# Patient Record
Sex: Male | Born: 1955 | Race: White | Hispanic: No | Marital: Married | Smoking: Current every day smoker
Health system: Southern US, Community
[De-identification: ages and names within clinical notes are randomized; demographics above are authoritative.]

## PROBLEM LIST (undated history)

## (undated) DIAGNOSIS — D61818 Other pancytopenia: Secondary | ICD-10-CM

## (undated) DIAGNOSIS — C61 Malignant neoplasm of prostate: Secondary | ICD-10-CM

## (undated) HISTORY — DX: Malignant neoplasm of prostate: C61

## (undated) HISTORY — DX: Other pancytopenia: D61.818

---

## 2006-05-23 ENCOUNTER — Encounter: Admission: RE | Admit: 2006-05-23 | Discharge: 2006-05-23 | Payer: Self-pay | Admitting: Internal Medicine

## 2006-06-06 ENCOUNTER — Other Ambulatory Visit: Admission: RE | Admit: 2006-06-06 | Discharge: 2006-06-06 | Payer: Self-pay | Admitting: Interventional Radiology

## 2006-06-06 ENCOUNTER — Encounter: Admission: RE | Admit: 2006-06-06 | Discharge: 2006-06-06 | Payer: Self-pay | Admitting: Internal Medicine

## 2006-06-06 ENCOUNTER — Encounter (INDEPENDENT_AMBULATORY_CARE_PROVIDER_SITE_OTHER): Payer: Self-pay | Admitting: Specialist

## 2008-08-27 ENCOUNTER — Encounter: Admission: RE | Admit: 2008-08-27 | Discharge: 2008-08-27 | Payer: Self-pay | Admitting: Occupational Medicine

## 2009-04-02 ENCOUNTER — Encounter: Admission: RE | Admit: 2009-04-02 | Discharge: 2009-04-02 | Payer: Self-pay | Admitting: Family Medicine

## 2010-12-08 IMAGING — US US SOFT TISSUE HEAD/NECK
1 series · 14 of 25 positions shown · non-contrast
Comparison: Ultrasound of the thyroid of 05/23/2006

CLINICAL DATA: Right thyroid nodule noted on physical exam

THYROID ULTRASOUND
TECHNIQUE: Ultrasound examination of the thyroid gland and
adjacent soft tissues was performed.

[Series 1: us soft tissue head/neck · 0.06mm/px · 14 of 25 slices shown]
[im 1/25]
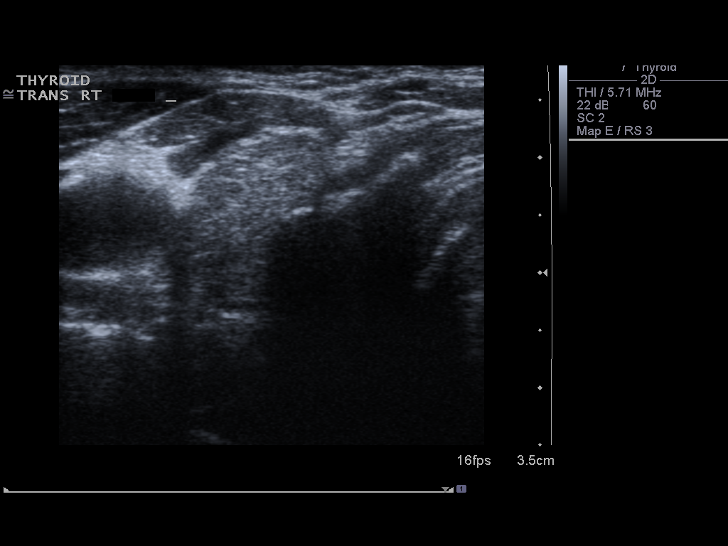
[im 3/25]
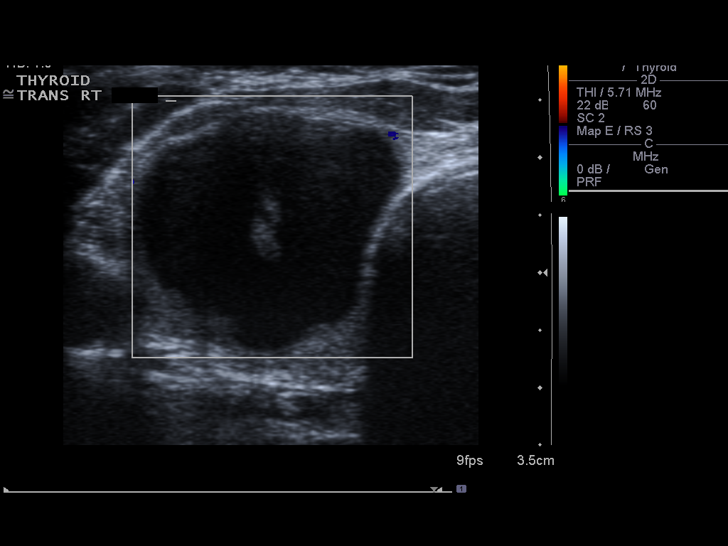
[im 5/25]
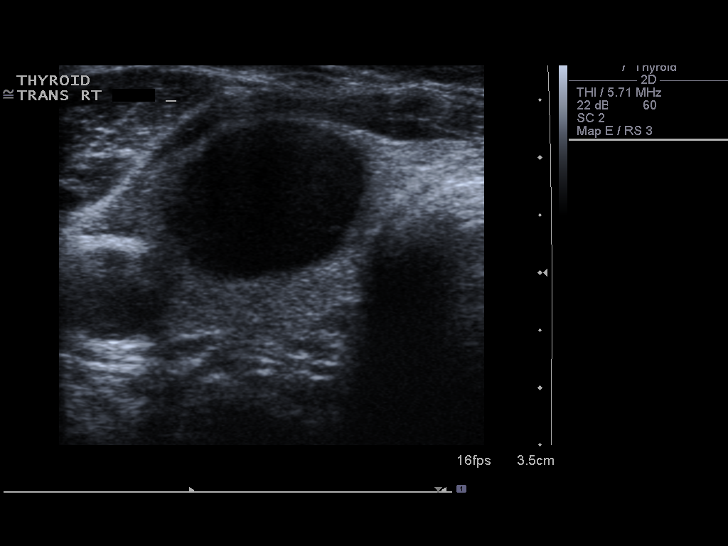
[im 7/25]
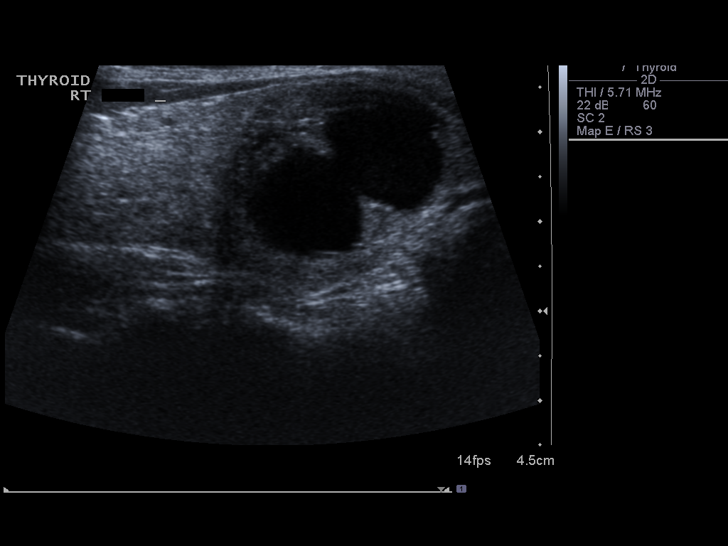
[im 9/25]
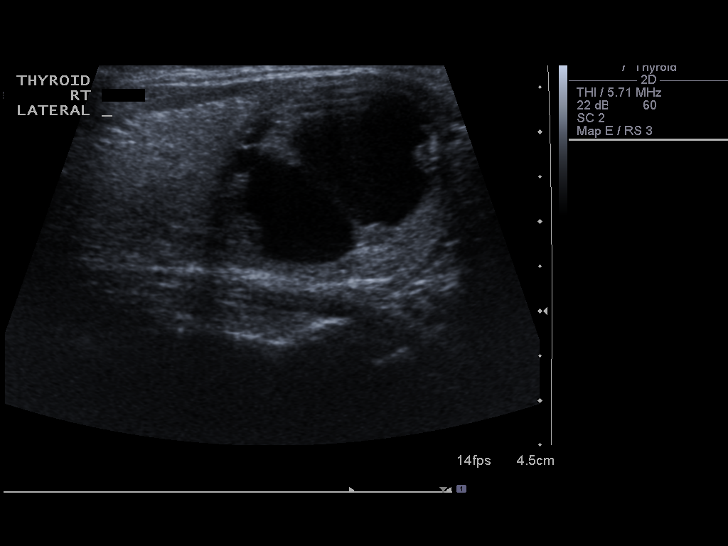
[im 10/25]
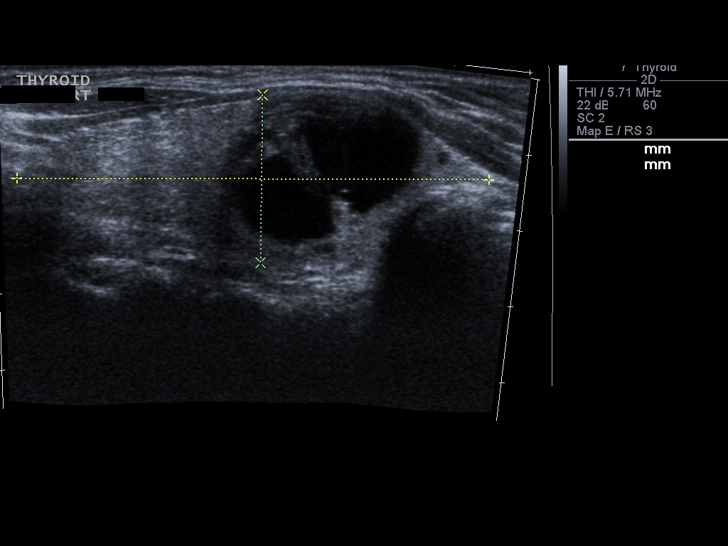
[im 12/25]
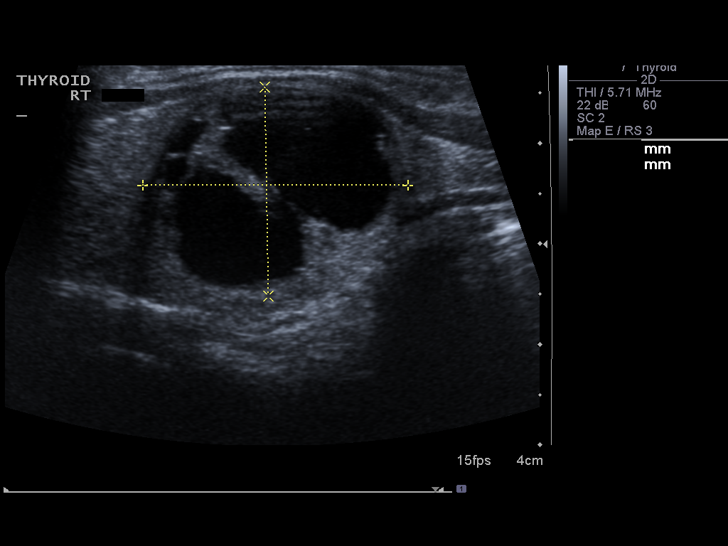
[im 14/25]
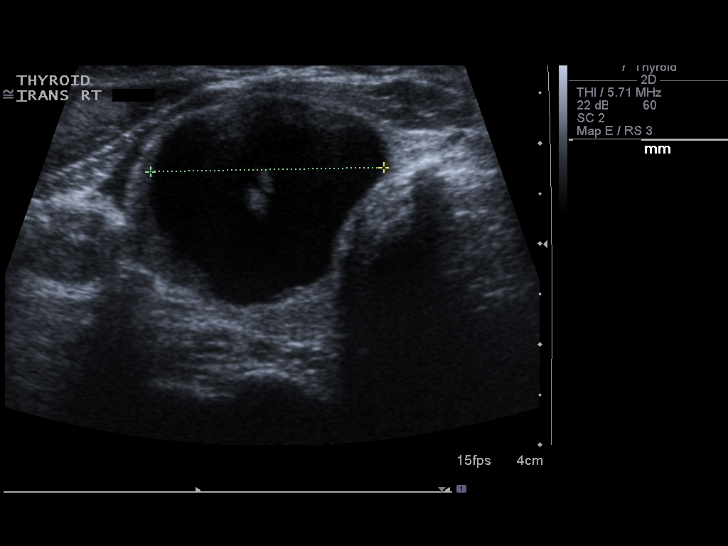
[im 16/25]
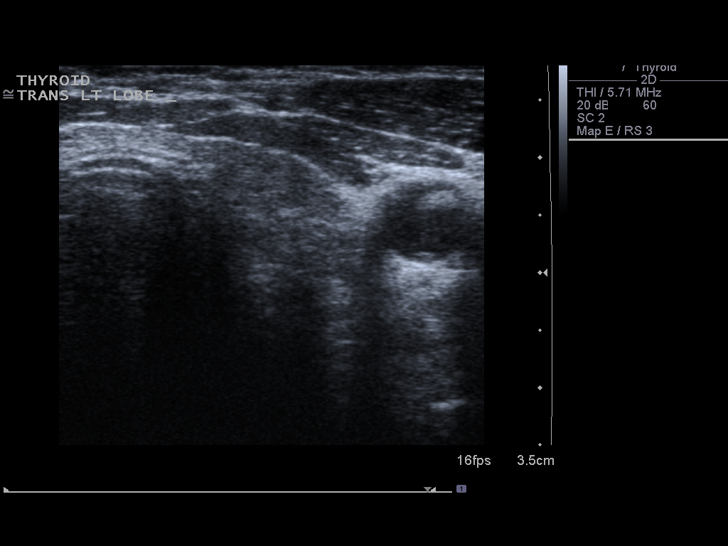
[im 17/25]
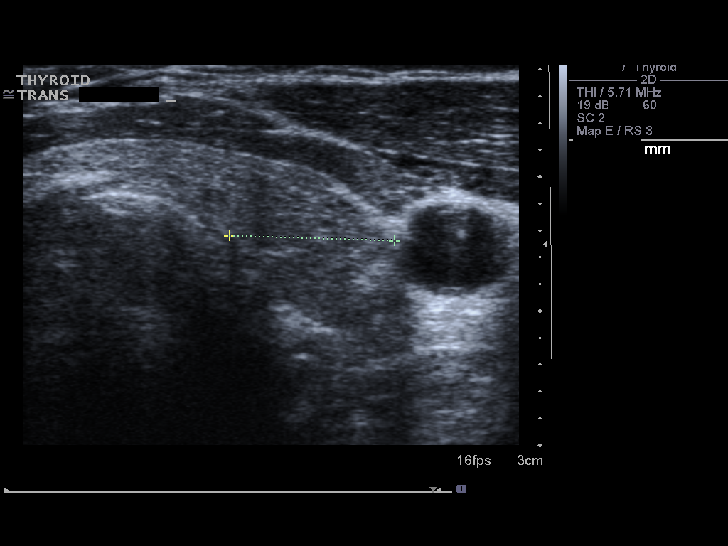
[im 19/25]
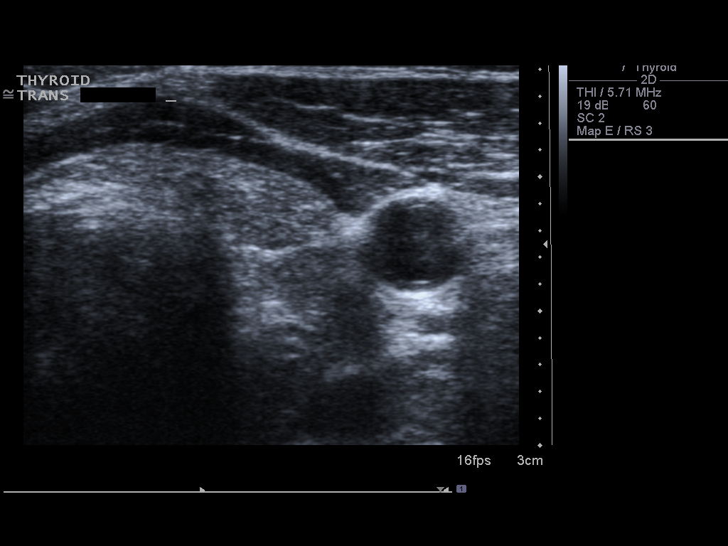
[im 21/25]
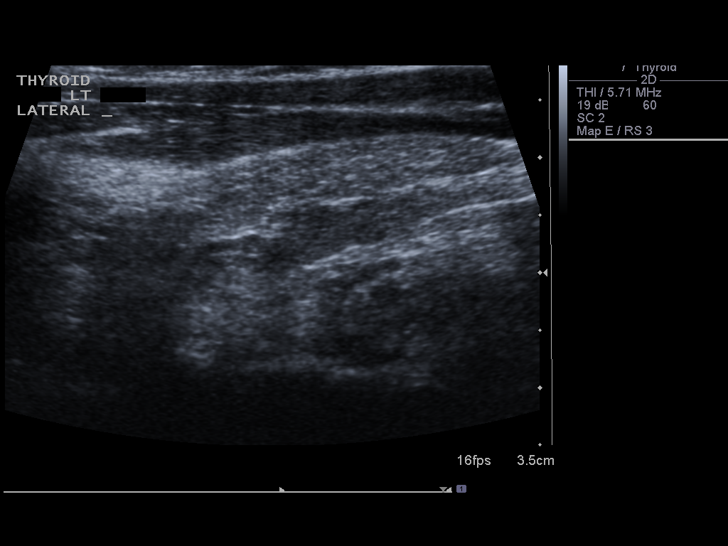
[im 23/25]
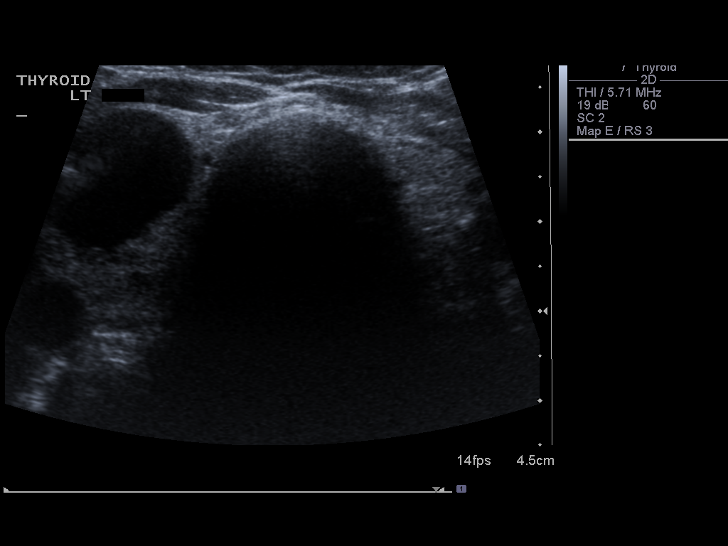
[im 25/25]
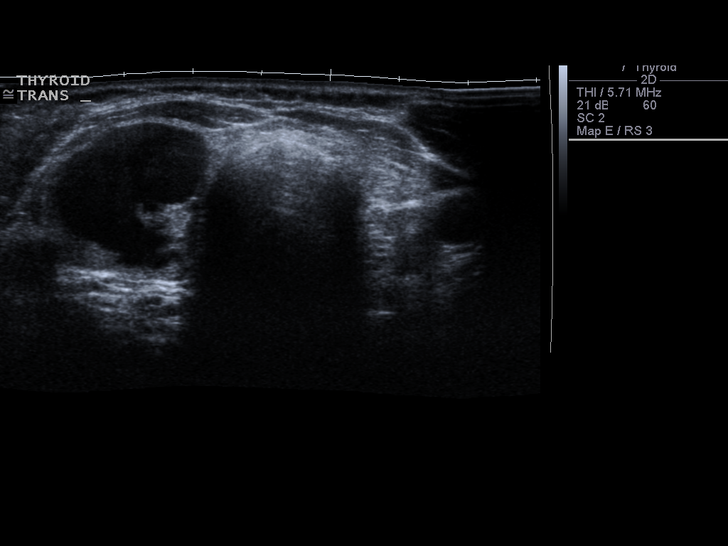

[14 of 25 positions shown; findings below may reference images not displayed]

FINDINGS: The thyroid gland is stable in size compared to the prior
study.  The right lobe measures 6.2 cm sagittally with a depth of
2.2 cm and width of 2.9 cm.  (Prior measurements were 5.5 x 2.0 x
1.9 cm).  The left lobe currently measures 4.5 x 1.1 x 1.2 cm with
the isthmus measuring 2 mm in thickness.  (Prior measurements were
4.6 x 1.1 x 1.2 cm with the isthmus measuring 3 mm).

The gland remains homogeneous.  Within the right lower lobe there
is a predominately cystic nodule present with some solid components
and septations measuring 2.6 x 2.1 x 2.3 cm.  This complex lesion
has not changed significantly in the interval where it previously
measured 1.9 x 1.2 x 1.8 cm. Also, this nodule was biopsied into
2660.  No additional thyroid nodule is seen.
IMPRESSION: No significant change in primarily cystic right lobe of thyroid
nodule with some internal septation when compared to the prior
study of 0556.  No new abnormality.

## 2013-10-01 ENCOUNTER — Ambulatory Visit (HOSPITAL_COMMUNITY)
Admission: RE | Admit: 2013-10-01 | Discharge: 2013-10-01 | Disposition: A | Discharge status: Home / Self Care | Source: Ambulatory Visit | Attending: Hematology & Oncology | Admitting: Hematology & Oncology

## 2013-10-01 DIAGNOSIS — C800 Disseminated malignant neoplasm, unspecified: Secondary | ICD-10-CM | POA: Insufficient documentation

## 2013-10-01 DIAGNOSIS — C61 Malignant neoplasm of prostate: Secondary | ICD-10-CM | POA: Insufficient documentation

## 2013-10-15 ENCOUNTER — Encounter: Payer: Self-pay | Admitting: *Deleted

## 2013-10-18 ENCOUNTER — Telehealth: Payer: Self-pay | Admitting: Hematology & Oncology

## 2013-10-18 ENCOUNTER — Other Ambulatory Visit: Payer: Self-pay | Admitting: *Deleted

## 2013-10-18 DIAGNOSIS — C61 Malignant neoplasm of prostate: Secondary | ICD-10-CM

## 2013-10-18 NOTE — Telephone Encounter (Signed)
Left vm w NEW PATIENT today to remind them of their appointment with Dr. Ennever. Also, advised them to bring all medication bottles and insurance card information. ° °

## 2013-10-21 ENCOUNTER — Other Ambulatory Visit (HOSPITAL_BASED_OUTPATIENT_CLINIC_OR_DEPARTMENT_OTHER): Payer: Medicare Other | Admitting: Lab

## 2013-10-21 ENCOUNTER — Telehealth: Payer: Self-pay | Admitting: *Deleted

## 2013-10-21 ENCOUNTER — Encounter: Payer: Self-pay | Admitting: Hematology & Oncology

## 2013-10-21 ENCOUNTER — Other Ambulatory Visit: Payer: Self-pay | Admitting: Lab

## 2013-10-21 ENCOUNTER — Ambulatory Visit (HOSPITAL_BASED_OUTPATIENT_CLINIC_OR_DEPARTMENT_OTHER): Payer: Medicare Other | Admitting: Hematology & Oncology

## 2013-10-21 ENCOUNTER — Ambulatory Visit: Payer: Self-pay | Admitting: Hematology & Oncology

## 2013-10-21 ENCOUNTER — Ambulatory Visit: Payer: Managed Care, Other (non HMO)

## 2013-10-21 ENCOUNTER — Ambulatory Visit: Payer: Self-pay

## 2013-10-21 VITALS — BP 88/52 | HR 70 | Temp 98.2°F | Resp 18 | Ht 69.0 in | Wt 146.0 lb

## 2013-10-21 DIAGNOSIS — C61 Malignant neoplasm of prostate: Secondary | ICD-10-CM

## 2013-10-21 DIAGNOSIS — C800 Disseminated malignant neoplasm, unspecified: Secondary | ICD-10-CM

## 2013-10-21 DIAGNOSIS — C801 Malignant (primary) neoplasm, unspecified: Secondary | ICD-10-CM

## 2013-10-21 DIAGNOSIS — D61818 Other pancytopenia: Secondary | ICD-10-CM

## 2013-10-21 HISTORY — DX: Other pancytopenia: D61.818

## 2013-10-21 HISTORY — DX: Malignant neoplasm of prostate: C61

## 2013-10-21 LAB — CBC WITH DIFFERENTIAL (CANCER CENTER ONLY)
BASO#: 0 10*3/uL (ref 0.0–0.2)
BASO%: 0.4 % (ref 0.0–2.0)
EOS ABS: 0 10*3/uL (ref 0.0–0.5)
EOS%: 1.4 % (ref 0.0–7.0)
HCT: 27.9 % — ABNORMAL LOW (ref 38.7–49.9)
HGB: 9.7 g/dL — ABNORMAL LOW (ref 13.0–17.1)
LYMPH#: 0.6 10*3/uL — AB (ref 0.9–3.3)
LYMPH%: 20.4 % (ref 14.0–48.0)
MCH: 30.8 pg (ref 28.0–33.4)
MCHC: 34.8 g/dL (ref 32.0–35.9)
MCV: 89 fL (ref 82–98)
MONO#: 0.3 10*3/uL (ref 0.1–0.9)
MONO%: 11.9 % (ref 0.0–13.0)
NEUT%: 65.9 % (ref 40.0–80.0)
NEUTROS ABS: 1.9 10*3/uL (ref 1.5–6.5)
PLATELETS: 17 10*3/uL — AB (ref 145–400)
RBC: 3.15 10*6/uL — AB (ref 4.20–5.70)
RDW: 13 % (ref 11.1–15.7)
WBC: 2.9 10*3/uL — AB (ref 4.0–10.0)

## 2013-10-21 LAB — TECHNOLOGIST REVIEW CHCC SATELLITE

## 2013-10-21 LAB — CMP (CANCER CENTER ONLY)
ALT: 9 U/L — AB (ref 10–47)
AST: 21 U/L (ref 11–38)
Albumin: 2.9 g/dL — ABNORMAL LOW (ref 3.3–5.5)
Alkaline Phosphatase: 587 U/L — ABNORMAL HIGH (ref 26–84)
BILIRUBIN TOTAL: 0.9 mg/dL (ref 0.20–1.60)
BUN, Bld: 12 mg/dL (ref 7–22)
CALCIUM: 9 mg/dL (ref 8.0–10.3)
CHLORIDE: 101 meq/L (ref 98–108)
CO2: 29 meq/L (ref 18–33)
Creat: 0.5 mg/dl — ABNORMAL LOW (ref 0.6–1.2)
Glucose, Bld: 106 mg/dL (ref 73–118)
POTASSIUM: 4.3 meq/L (ref 3.3–4.7)
SODIUM: 141 meq/L (ref 128–145)
TOTAL PROTEIN: 6.7 g/dL (ref 6.4–8.1)

## 2013-10-21 LAB — HOLD TUBE, BLOOD BANK - CHCC SATELLITE

## 2013-10-21 MED ORDER — FENTANYL 100 MCG/HR TD PT72: 100.0000 ug | MEDICATED_PATCH | TRANSDERMAL | Status: DC

## 2013-10-21 MED ORDER — FENTANYL 100 MCG/HR TD PT72: MEDICATED_PATCH | TRANSDERMAL | Status: DC

## 2013-10-21 MED ORDER — AMINOCAPROIC ACID 500 MG PO TABS: ORAL_TABLET | ORAL | Status: DC

## 2013-10-21 MED ORDER — FENTANYL CITRATE 100 MCG BU TABS: 100.0000 ug | ORAL_TABLET | BUCCAL | Status: DC | PRN

## 2013-10-21 MED ORDER — ALPRAZOLAM 1 MG PO TABS: 1.0000 mg | ORAL_TABLET | Freq: Every evening | ORAL | Status: DC | PRN

## 2013-10-21 MED ORDER — ALPRAZOLAM 1 MG PO TABS
1.0000 mg | ORAL_TABLET | Freq: Every evening | ORAL | Status: AC | PRN
Start: 2013-10-21 — End: ?

## 2013-10-21 NOTE — Telephone Encounter (Signed)
Made referral for [facility] per Dr Marin Olp order.

## 2013-10-21 NOTE — Progress Notes (Signed)
Referral MD  Reason for Referral:  Metastatic castrateresistant prostate cancer  Chief Complaint  Patient presents with  . NEW PATIENT  : I just moved up from [place] to be care for him I family  HPI: Mr. Pullman is a very nice 58 year old gentleman.He just got appear from soft to loud. He been down in [place]. He been getting care for metastatic prostate cancer. He's been through all lines of therapy.  He presented with localized prostate cancer about 4 years ago. He was treated at [facility]. He underwent a laparoscopic prostatectomy. At the time of his diagnosis, his PSA was about 9.  His PSA went down to less than 0.01. Unfortunately, he then began to have an increase in the PSA. The studies did not show any obvious metastatic disease. He underwent salvage radiation therapy at [facility].  PSA continued to rise. He was then enrolled in a clinical trial of. He was randomized to androgen depravation therapy. He went off study in June of 2010. He was placed on Lupron and Casodex.  His PSA improved a little bit.  He then went on toshow progression. He was then treated with Provenge He received this from February to 05/05/10. His PSA continued to rise. He was then placed onto [facility]. He there was given Zytiga.  You follow her onto chemotherapy with Taxotere and carboplatin. He had 2 cycles. He did have a reaction to the chemotherapy.  He then was placed on cabazitaxel. He had 11 cycles. His PSA seemed to improve.he had more hip pain. He did get radiation therapy. He was then started on some radium-223. This was in December of 2013. He then had carboplatinum given with cazaitaxel.. There was no benefit.  He had more radiation therapy at [facility] for an epidural mass.  He was started on XTANDI in June 2015.  He last got Lupron in I think in July. He also got Xgeva in July.  He did have genetic studies done. He had a battery of genetic tests done. This was in August of 2015. He was thought to  have 5 different genetic alterations.  He fine movement of to the [place] area. He is doing appear. Unfortunately, his wife passed away back in late May 05, 2022 of metastatic breast cancer. She did not have for that long.  He had onset of pain got appear. This I brought him. He is on a Duragesic patch. He does have that increased to 200 mcg a day. He is on Dilaudid for breakthrough pain.  He is eating okay. He had lost quite awake. He's had no bleeding.  We are asked to see him so we can establish care with him and allow him to have a good quality of life.  He was getting blood work done weekly. He is getting transfused almost weekly. Sometimes to get to platelet is use week.  He's not on Amicarr. This is quite shocking to me he says his platelet count is still low. II think thatthe Amicar can help.  I would only transfuse platelets if he is bleeding or if his blood count less than 5000. His resolution transfused 4 a hemoglobin less than 8.  His genetic alterations, he has the following:  TP53, CTNNB1, NF1, MYC, AR.  Currently, his performance status is ECOG 3.  He clearly needs hospice. I will see about getting this set up for him.  We will go ahead and get his prescriptions refilled.         Past Medical  History  Diagnosis Date  . Prostate cancer metastatic to multiple sites 10/21/2013  . Acquired pancytopenia 10/21/2013  :  No past surgical history on file.:  Current outpatient prescriptions:buPROPion (WELLBUTRIN XL) 300 MG 24 hr tablet, Take 300 mg by mouth daily., Disp: , Rfl: ;  Celecoxib (CELEBREX PO), Take 200 mg by mouth 2 (two) times daily., Disp: , Rfl: ;  Denosumab (XGEVA [place]), Inject into the skin every 3 (three) months. LAST DOSE  08-05-13, Disp: , Rfl: ;  fentaNYL (DURAGESIC - DOSED MCG/HR) 100 MCG/HR, Apply 2 patches to skin every 2 days, Disp: 30 patch, Rfl: 0 fentaNYL (FENTORA) 100 MCG buccal tablet, Place 1 tablet (100 mcg total) inside cheek every 4 (four) hours as  needed for breakthrough pain or severe pain (takes 200 mg patches)., Disp: 60 tablet, Rfl: 0;  HYDROmorphone (DILAUDID) 4 MG tablet, Take by mouth every 4 (four) hours as needed for severe pain., Disp: , Rfl: ;  leuprolide (LUPRON) 22.5 MG injection, Inject 22.5 mg into the muscle every 3 (three) months. LAST DOSE 04-22-13, Disp: , Rfl:  lidocaine (LIDODERM) 5 %, Place 1 patch onto the skin daily. Remove & Discard patch within 12 hours or as directed by MD, Disp: , Rfl: ;  ondansetron (ZOFRAN) 8 MG tablet, Take by mouth every 8 (eight) hours as needed for nausea or vomiting., Disp: , Rfl: ;  predniSONE (DELTASONE) 10 MG tablet, Take 10 mg by mouth daily with breakfast., Disp: , Rfl: ;  Pregabalin (LYRICA PO), Take by mouth., Disp: , Rfl:  ALPRAZolam (XANAX) 1 MG tablet, Take 1 tablet (1 mg total) by mouth at bedtime as needed for anxiety., Disp: 90 tablet, Rfl: 0;  aminocaproic acid (AMICAR) 500 MG tablet, Take 2 tablets 3 times a day, Disp: 180 tablet, Rfl: 2:  :  No Known Allergies:  No family history on file.:  History   Social History  . Marital Status: Married    Spouse Name: N/A    Number of Children: N/A  . Years of Education: N/A   Occupational History  . Not on file.   Social History Main Topics  . Smoking status: Current Every Day Smoker -- 1.50 packs/day for 27 years    Types: Cigarettes    Start date: 03/23/1985  . Smokeless tobacco: Never Used     Comment: still smoking   10-2013  . Alcohol Use: Not on file  . Drug Use: Not on file  . Sexual Activity: Not on file   Other Topics Concern  . Not on file   Social History Narrative  . No narrative on file  :  Pertinent items are noted in HPI.  Exam: $Remo'@[email or web address]'[id number]$ @ Thin white gentleman in no obvious distress. Vital signs show a temperature of 98.2. Pulse 70. Blood pressure 88/52. Weight is 146 pounds. Head and exam shows no ocular or oral lesions. He has no palpable cervical or supraclavicular lymph nodes. Lungs are clear.  Cardiac exam regular in rhythm with a normal S1 and S2. There are no murmurs rubs or bruits. Abdomen is soft. He has good bowel sounds. There is a palpable abdominal mass. He has no fluid wave. There is no guarding or rebound tenderness. He has no palpable liver or spleen tip. Back exam shows no tenderness over the spine, ribs or hips. Extremities shows some atrophy in upper and lower extremities.He has decent strength in his legs. Neurological exam shows no focal neurological deficits.   Recent Labs  10/21/13 0844  WBC  2.9*  HGB 9.7*  HCT 27.9*  PLT 17*    Recent Labs  10/21/13 0844  NA 141  K 4.3  CL 101  CO2 29  GLUCOSE 106  BUN 12  CREATININE 0.5*  CALCIUM 9.0    Blood smear review: no data  Pathology:no data   Assessment and Plan: Mr. Rogerson is a very nice 58 year old gentleman. He has castrate resistant prostate cancer. He's been through all therapy.the goal is to get him as good of a quality of life as possible.  Again, we will get hospice involved. I think this will be very important.  Am surprised that he did does not have a central line. I think a PICC line will be incredibly important. I will see if radiology can do this for [place].  We'll have to see about arranging for transportation for him.  I spent over an hour with he and his family. They're all very nice. They all want a good quality of life for him.  We shall plan to get him back to see [place] in another 2 weeks. I still think that it is important for [place] to keep him on a bone hardening medicine. Also, I think that we have to keep him on low levels of testosterone.

## 2013-10-22 ENCOUNTER — Encounter: Payer: Self-pay | Admitting: *Deleted

## 2013-10-22 NOTE — Progress Notes (Signed)
[facility] RN called for question on Prednisone. Dr Marin Olp stated he wants the pt to only take 5mg  of Prednisone daily instead of 10mg .

## 2013-10-23 ENCOUNTER — Telehealth: Payer: Self-pay

## 2013-10-23 ENCOUNTER — Telehealth: Payer: Self-pay | Admitting: Hematology & Oncology

## 2013-10-23 ENCOUNTER — Other Ambulatory Visit: Payer: Self-pay

## 2013-10-23 MED ORDER — LACTULOSE 10 GM/15ML PO SOLN: 10.0000 g | Freq: Every day | ORAL | Status: AC | PRN

## 2013-10-23 NOTE — Telephone Encounter (Signed)
Received call from pt requesting a letter be drafted by Dr Ginette Pitman to request son be able to come home from [place] to support family since pt's referral to hospice. Letter drafted & signed by PRE. Left at front desk for pick up per request. Copy to MR to be scanned. dph

## 2013-10-23 NOTE — Telephone Encounter (Signed)
Otilio Carpen,  [facility] has received a PA determination from your patient's insurance. The PA request for your patient has been archived and marked as 'Approved'.   Patient: Hunter Vidales., DOB: Jul 11, 1955  Drug: FentaNYL 100MCG/HR 72 hr patches  PA created date: 10/21/2013  Outcome: Approved

## 2013-10-24 ENCOUNTER — Ambulatory Visit (HOSPITAL_COMMUNITY)
Admission: RE | Admit: 2013-10-24 | Discharge: 2013-10-24 | Disposition: A | Discharge status: Home / Self Care | Source: Ambulatory Visit | Attending: Hematology & Oncology | Admitting: Hematology & Oncology

## 2013-10-24 ENCOUNTER — Other Ambulatory Visit: Payer: Self-pay | Admitting: Nurse Practitioner

## 2013-10-24 ENCOUNTER — Other Ambulatory Visit: Payer: Self-pay | Admitting: Hematology & Oncology

## 2013-10-24 DIAGNOSIS — C61 Malignant neoplasm of prostate: Secondary | ICD-10-CM | POA: Diagnosis present

## 2013-10-24 DIAGNOSIS — D61818 Other pancytopenia: Secondary | ICD-10-CM

## 2013-10-24 DIAGNOSIS — C800 Disseminated malignant neoplasm, unspecified: Secondary | ICD-10-CM | POA: Diagnosis not present

## 2013-10-24 MED ORDER — PREGABALIN 100 MG PO CAPS: 100.0000 mg | ORAL_CAPSULE | Freq: Two times a day (BID) | ORAL | Status: DC

## 2013-10-24 NOTE — Discharge Instructions (Signed)
PICC Home Guide A peripherally inserted central catheter (PICC) is a long, thin, flexible tube that is inserted into a vein in the upper arm. It is a form of intravenous (IV) access. It is considered to be a "central" line because the tip of the PICC ends in a large vein in your chest. This large vein is called the superior vena cava (SVC). The PICC tip ends in the SVC because there is a lot of blood flow in the SVC. This allows medicines and IV fluids to be quickly distributed throughout the body. The PICC is inserted using a sterile technique by a specially trained nurse or physician. After the PICC is inserted, a chest X-ray exam is done to be sure it is in the correct place.  A PICC may be placed for different reasons, such as:  To give medicines and liquid nutrition that can only be given through a central line. Examples are:  Certain antibiotic treatments.  Chemotherapy.  Total parenteral nutrition (TPN).  To take frequent blood samples.  To give IV fluids and blood products.  If there is difficulty placing a peripheral intravenous (PIV) catheter. If taken care of properly, a PICC can remain in place for several months. A PICC can also allow a person to go home from the hospital early. Medicine and PICC care can be managed at home by a family member or home health care team. WHAT PROBLEMS CAN HAPPEN WHEN I HAVE A PICC? Problems with a PICC can occasionally occur. These may include the following:  A blood clot (thrombus) forming in or at the tip of the PICC. This can cause the PICC to become clogged. A clot-dissolving medicine called tissue plasminogen activator (tPA) can be given through the PICC to help break up the clot.  Inflammation of the vein (phlebitis) in which the PICC is placed. Signs of inflammation may include redness, pain at the insertion site, red streaks, or being able to feel a "cord" in the vein where the PICC is located.  Infection in the PICC or at the insertion  site. Signs of infection may include fever, chills, redness, swelling, or pus drainage from the PICC insertion site.  PICC movement (malposition). The PICC tip may move from its original position due to excessive physical activity, forceful coughing, sneezing, or vomiting.  A break or cut in the PICC. It is important to not use scissors near the PICC.  Nerve or tendon irritation or injury during PICC insertion. WHAT SHOULD I KEEP IN MIND ABOUT ACTIVITIES WHEN I HAVE A PICC?  You may bend your arm and move it freely. If your PICC is near or at the bend of your elbow, avoid activity with repeated motion at the elbow.  Rest at home for the remainder of the day following PICC line insertion.  Avoid lifting heavy objects as instructed by your health care provider.  Avoid using a crutch with the arm on the same side as your PICC. You may need to use a walker. WHAT SHOULD I KNOW ABOUT MY PICC DRESSING?  Keep your PICC bandage (dressing) clean and dry to prevent infection.  Ask your health care provider when you may shower. Ask your health care provider to teach you how to wrap the PICC when you do take a shower.  Change the PICC dressing as instructed by your health care provider.  Change your PICC dressing if it becomes loose or wet. WHAT SHOULD I KNOW ABOUT PICC CARE?  Check the PICC insertion site   daily for leakage, redness, swelling, or pain.  Do not take a bath, swim, or use hot tubs when you have a PICC. Cover PICC line with clear plastic wrap and tape to keep it dry while showering.  Flush the PICC as directed by your health care provider. Let your health care provider know right away if the PICC is difficult to flush or does not flush. Do not use force to flush the PICC.  Do not use a syringe that is less than 10 mL to flush the PICC.  Never pull or tug on the PICC.  Avoid blood pressure checks on the arm with the PICC.  Keep your PICC identification card with you at all  times.  Do not take the PICC out yourself. Only a trained clinical professional should remove the PICC. SEEK IMMEDIATE MEDICAL CARE IF:  Your PICC is accidentally pulled all the way out. If this happens, cover the insertion site with a bandage or gauze dressing. Do not throw the PICC away. Your health care provider will need to inspect it.  Your PICC was tugged or pulled and has partially come out. Do not  push the PICC back in.  There is any type of drainage, redness, or swelling where the PICC enters the skin.  You cannot flush the PICC, it is difficult to flush, or the PICC leaks around the insertion site when it is flushed.  You hear a "flushing" sound when the PICC is flushed.  You have pain, discomfort, or numbness in your arm, shoulder, or jaw on the same side as the PICC.  You feel your heart "racing" or skipping beats.  You notice a hole or tear in the PICC.  You develop chills or a fever. MAKE SURE YOU:   Understand these instructions.  Will watch your condition.  Will get help right away if you are not doing well or get worse. Document Released: 07/24/2002 Document Revised: 06/03/2013 Document Reviewed: 09/24/2012 [employer] Patient Information 2015 [employer]. This information is not intended to replace advice given to you by your health care provider. Make sure you discuss any questions you have with your health care provider.  

## 2013-10-24 NOTE — Procedures (Signed)
Successful RUE DL POWER PICC NO COMP STABLE FULL REPORT IN PACS READY FOR USE

## 2013-10-28 ENCOUNTER — Encounter: Payer: Self-pay | Admitting: Nurse Practitioner

## 2013-10-28 NOTE — Progress Notes (Unsigned)
Received a call from Patty with Hospice, states his hgb was 6.9. Per Dr. Marin Olp pt is to be transfused 2 units.Will schedule per Hospice on Wednesday. They will draw T&C tomorrow 9/29 and take to hospital.

## 2013-10-29 ENCOUNTER — Other Ambulatory Visit: Payer: Self-pay | Admitting: *Deleted

## 2013-10-29 ENCOUNTER — Encounter: Payer: Self-pay | Admitting: *Deleted

## 2013-10-29 DIAGNOSIS — C800 Disseminated malignant neoplasm, unspecified: Secondary | ICD-10-CM | POA: Diagnosis not present

## 2013-10-29 DIAGNOSIS — C61 Malignant neoplasm of prostate: Secondary | ICD-10-CM | POA: Diagnosis present

## 2013-10-29 LAB — ABO/RH: ABO/RH(D): A POS

## 2013-10-29 NOTE — Progress Notes (Signed)
Called pt and informed him that we will give him 2 units of blood on 10/30/13 at 8:30am. Pt verbalized understanding.

## 2013-10-30 ENCOUNTER — Ambulatory Visit (HOSPITAL_BASED_OUTPATIENT_CLINIC_OR_DEPARTMENT_OTHER)

## 2013-10-30 VITALS — BP 103/63 | HR 86 | Temp 98.0°F | Resp 16

## 2013-10-30 DIAGNOSIS — C61 Malignant neoplasm of prostate: Secondary | ICD-10-CM

## 2013-10-30 DIAGNOSIS — D649 Anemia, unspecified: Secondary | ICD-10-CM

## 2013-10-30 MED ORDER — SODIUM CHLORIDE 0.9 % IJ SOLN
10.0000 mL | INTRAMUSCULAR | Status: DC | PRN
  Filled 2013-10-30: qty 10

## 2013-10-30 MED ORDER — HEPARIN SOD (PORK) LOCK FLUSH 100 UNIT/ML IV SOLN
250.0000 [IU] | INTRAVENOUS | Status: AC | PRN
  Administered 2013-10-30: 250 [IU]
  Filled 2013-10-30: qty 5

## 2013-10-30 MED ORDER — ACETAMINOPHEN 325 MG PO TABS
650.0000 mg | ORAL_TABLET | Freq: Once | ORAL | Status: AC
  Administered 2013-10-30: 650 mg via ORAL

## 2013-10-30 MED ORDER — ALTEPLASE 2 MG IJ SOLR
2.0000 mg | Freq: Once | INTRAMUSCULAR | Status: DC | PRN
  Filled 2013-10-30: qty 2

## 2013-10-30 MED ORDER — SODIUM CHLORIDE 0.9 % IJ SOLN
10.0000 mL | INTRAMUSCULAR | Status: AC | PRN
  Administered 2013-10-30: 10 mL
  Filled 2013-10-30: qty 10

## 2013-10-30 MED ORDER — HEPARIN SOD (PORK) LOCK FLUSH 100 UNIT/ML IV SOLN
500.0000 [IU] | INTRAVENOUS | Status: AC | PRN
  Administered 2013-10-30: 250 [IU]
  Filled 2013-10-30: qty 5

## 2013-10-30 MED ORDER — ACETAMINOPHEN 325 MG PO TABS
ORAL_TABLET | ORAL | Status: AC
  Filled 2013-10-30: qty 2

## 2013-10-30 MED ORDER — FUROSEMIDE 10 MG/ML IJ SOLN: 20.0000 mg | Freq: Once | INTRAMUSCULAR | Status: DC

## 2013-10-30 MED ORDER — DIPHENHYDRAMINE HCL 25 MG PO CAPS
25.0000 mg | ORAL_CAPSULE | Freq: Once | ORAL | Status: AC
  Administered 2013-10-30: 25 mg via ORAL

## 2013-10-30 MED ORDER — DIPHENHYDRAMINE HCL 25 MG PO CAPS
ORAL_CAPSULE | ORAL | Status: AC
Start: 2013-10-30 — End: 2013-10-30
  Filled 2013-10-30: qty 1

## 2013-10-30 MED ORDER — ANTICOAGULANT SODIUM CITRATE 4% (200MG/5ML) IV SOLN: 5.0000 mL | Status: DC | PRN

## 2013-10-30 MED ORDER — SODIUM CHLORIDE 0.9 % IV SOLN
250.0000 mL | Freq: Once | INTRAVENOUS | Status: AC
  Administered 2013-10-30: 250 mL via INTRAVENOUS

## 2013-10-30 MED ORDER — HEPARIN SOD (PORK) LOCK FLUSH 100 UNIT/ML IV SOLN
500.0000 [IU] | Freq: Every day | INTRAVENOUS | Status: DC | PRN
  Filled 2013-10-30: qty 5

## 2013-10-30 NOTE — Patient Instructions (Addendum)
Blood Transfusion  A blood transfusion replaces your blood or some of its parts. Blood is replaced when you have lost blood because of surgery, an accident, or for severe blood conditions like anemia. You can donate blood to be used on yourself if you have a planned surgery. If you lose blood during that surgery, your own blood can be given back to you. Any blood given to you is checked to make sure it matches your blood type. Your temperature, blood pressure, and heart rate (vital signs) will be checked often.  GET HELP RIGHT AWAY IF:   You feel sick to your stomach (nauseous) or throw up (vomit).  You have watery poop (diarrhea).  You have shortness of breath or trouble breathing.  You have blood in your pee (urine) or have dark colored pee.  You have chest pain or tightness.  Your eyes or skin turn yellow (jaundice).  You have a temperature by mouth above 102 F (38.9 C), not controlled by medicine.  You start to shake and have chills.  You develop a a red rash (hives) or feel itchy.  You develop lightheadedness or feel confused.  You develop back, joint, or muscle pain.  You do not feel hungry (lost appetite).  You feel tired, restless, or nervous.  You develop belly (abdominal) cramps. Document Released: 04/15/2008 Document Revised: 04/11/2011 Document Reviewed: 04/15/2008 [facility] Patient Information 2015 [place], [place]. This information is not intended to replace advice given to you by your health care provider. Make sure you discuss any questions you have with your health care provider. PICC Home Guide A peripherally inserted central catheter (PICC) is a long, thin, flexible tube that is inserted into a vein in the upper arm. It is a form of intravenous (IV) access. It is considered to be a "central" line because the tip of the PICC ends in a large vein in your chest. This large vein is called the superior vena cava (SVC). The PICC tip ends in the SVC because there is a  lot of blood flow in the SVC. This allows medicines and IV fluids to be quickly distributed throughout the body. The PICC is inserted using a sterile technique by a specially trained nurse or physician. After the PICC is inserted, a chest X-ray exam is done to be sure it is in the correct place.  A PICC may be placed for different reasons, such as:  To give medicines and liquid nutrition that can only be given through a central line. Examples are:  Certain antibiotic treatments.  Chemotherapy.  Total parenteral nutrition (TPN).  To take frequent blood samples.  To give IV fluids and blood products.  If there is difficulty placing a peripheral intravenous (PIV) catheter. If taken care of properly, a PICC can remain in place for several months. A PICC can also allow a person to go home from the hospital early. Medicine and PICC care can be managed at home by a family member or home health care team. [place] A PICC? Problems with a PICC can occasionally occur. These may include the following:  A blood clot (thrombus) forming in or at the tip of the PICC. This can cause the PICC to become clogged. A clot-dissolving medicine called tissue plasminogen activator (tPA) can be given through the PICC to help break up the clot.  Inflammation of the vein (phlebitis) in which the PICC is placed. Signs of inflammation may include redness, pain at the insertion site, red  streaks, or being able to feel a "cord" in the vein where the PICC is located.  Infection in the PICC or at the insertion site. Signs of infection may include fever, chills, redness, swelling, or pus drainage from the PICC insertion site.  PICC movement (malposition). The PICC tip may move from its original position due to excessive physical activity, forceful coughing, sneezing, or vomiting.  A break or cut in the PICC. It is important to not use scissors near the PICC.  Nerve or tendon irritation or  injury during PICC insertion. WHAT SHOULD I KEEP IN MIND ABOUT ACTIVITIES WHEN I HAVE A PICC?  You may bend your arm and move it freely. If your PICC is near or at the bend of your elbow, avoid activity with repeated motion at the elbow.  Rest at home for the remainder of the day following PICC line insertion.  Avoid lifting heavy objects as instructed by your health care provider.  Avoid using a crutch with the arm on the same side as your PICC. You may need to use a walker. WHAT SHOULD I KNOW ABOUT MY PICC DRESSING?  Keep your PICC bandage (dressing) clean and dry to prevent infection.  Ask your health care provider when you may shower. Ask your health care provider to teach you how to wrap the PICC when you do take a shower.  Change the PICC dressing as instructed by your health care provider.  Change your PICC dressing if it becomes loose or wet. WHAT SHOULD I KNOW ABOUT PICC CARE?  Check the PICC insertion site daily for leakage, redness, swelling, or pain.  Do not take a bath, swim, or use hot tubs when you have a PICC. Cover PICC line with clear plastic wrap and tape to keep it dry while showering.  Flush the PICC as directed by your health care provider. Let your health care provider know right away if the PICC is difficult to flush or does not flush. Do not use force to flush the PICC.  Do not use a syringe that is less than 10 mL to flush the PICC.  Never pull or tug on the PICC.  Avoid blood pressure checks on the arm with the PICC.  Keep your PICC identification card with you at all times.  Do not take the PICC out yourself. Only a trained clinical professional should remove the PICC. SEEK IMMEDIATE MEDICAL CARE IF:  Your PICC is accidentally pulled all the way out. If this happens, cover the insertion site with a bandage or gauze dressing. Do not throw the PICC away. Your health care provider will need to inspect it.  Your PICC was tugged or pulled and has  partially come out. Do not  push the PICC back in.  There is any type of drainage, redness, or swelling where the PICC enters the skin.  You cannot flush the PICC, it is difficult to flush, or the PICC leaks around the insertion site when it is flushed.  You hear a "flushing" sound when the PICC is flushed.  You have pain, discomfort, or numbness in your arm, shoulder, or jaw on the same side as the PICC.  You feel your heart "racing" or skipping beats.  You notice a hole or tear in the PICC.  You develop chills or a fever. MAKE SURE YOU:   Understand these instructions.  Will watch your condition.  Will get help right away if you are not doing well or get worse. Document Released:  07/24/2002 Document Revised: 06/03/2013 Document Reviewed: 09/24/2012 [facility] Patient Information 2015 [place], [place]. This information is not intended to replace advice given to you by your health care provider. Make sure you discuss any questions you have with your health care provider.

## 2013-10-31 ENCOUNTER — Encounter: Payer: Self-pay | Admitting: Nurse Practitioner

## 2013-10-31 ENCOUNTER — Encounter: Payer: Self-pay | Admitting: Hematology & Oncology

## 2013-10-31 LAB — TYPE AND SCREEN
ABO/RH(D): A POS
ANTIBODY SCREEN: NEGATIVE
UNIT DIVISION: 0
Unit division: 0

## 2013-10-31 NOTE — Progress Notes (Signed)
Orders given to [place], RN [phone]) to draw T&C when drawing his cbc on Monday. We will contact patient after we obtain cbc results if transfusion is needed.

## 2013-11-04 ENCOUNTER — Ambulatory Visit (HOSPITAL_COMMUNITY)
Admission: RE | Admit: 2013-11-04 | Discharge: 2013-11-04 | Disposition: A | Discharge status: Home / Self Care | Payer: Managed Care, Other (non HMO) | Source: Ambulatory Visit | Attending: Hematology & Oncology | Admitting: Hematology & Oncology

## 2013-11-04 DIAGNOSIS — C61 Malignant neoplasm of prostate: Secondary | ICD-10-CM

## 2013-11-04 DIAGNOSIS — D61818 Other pancytopenia: Secondary | ICD-10-CM | POA: Insufficient documentation

## 2013-11-04 DIAGNOSIS — C799 Secondary malignant neoplasm of unspecified site: Secondary | ICD-10-CM | POA: Insufficient documentation

## 2013-11-05 ENCOUNTER — Encounter: Payer: Self-pay | Admitting: Nurse Practitioner

## 2013-11-05 NOTE — Progress Notes (Signed)
Spoke with patient and Patty,RN with [facility] and informed them that per Dr. Marin Olp pt did not need a transfusion at this time. Hgb was 7.6 and he is ok with this. Pt verbalized understanding and appreciation.

## 2013-11-06 ENCOUNTER — Encounter: Payer: Self-pay | Admitting: Hematology & Oncology

## 2013-11-06 ENCOUNTER — Other Ambulatory Visit: Payer: Self-pay

## 2013-11-06 DIAGNOSIS — C61 Malignant neoplasm of prostate: Secondary | ICD-10-CM

## 2013-11-07 ENCOUNTER — Ambulatory Visit: Payer: Medicare Other

## 2013-11-07 ENCOUNTER — Other Ambulatory Visit (HOSPITAL_BASED_OUTPATIENT_CLINIC_OR_DEPARTMENT_OTHER): Admitting: Lab

## 2013-11-07 ENCOUNTER — Ambulatory Visit (HOSPITAL_BASED_OUTPATIENT_CLINIC_OR_DEPARTMENT_OTHER): Admitting: Hematology & Oncology

## 2013-11-07 ENCOUNTER — Encounter: Payer: Self-pay | Admitting: Hematology & Oncology

## 2013-11-07 VITALS — BP 81/50 | HR 56 | Temp 98.6°F | Resp 20 | Ht 69.0 in | Wt 139.0 lb

## 2013-11-07 DIAGNOSIS — R64 Cachexia: Secondary | ICD-10-CM

## 2013-11-07 DIAGNOSIS — D61818 Other pancytopenia: Secondary | ICD-10-CM | POA: Diagnosis present

## 2013-11-07 DIAGNOSIS — C799 Secondary malignant neoplasm of unspecified site: Secondary | ICD-10-CM | POA: Diagnosis not present

## 2013-11-07 DIAGNOSIS — C61 Malignant neoplasm of prostate: Secondary | ICD-10-CM

## 2013-11-07 LAB — CMP (CANCER CENTER ONLY)
ALT(SGPT): 14 U/L (ref 10–47)
AST: 38 U/L (ref 11–38)
Albumin: 3 g/dL — ABNORMAL LOW (ref 3.3–5.5)
Alkaline Phosphatase: 711 U/L — ABNORMAL HIGH (ref 26–84)
BILIRUBIN TOTAL: 1.1 mg/dL (ref 0.20–1.60)
BUN, Bld: 15 mg/dL (ref 7–22)
CO2: 28 meq/L (ref 18–33)
CREATININE: 0.4 mg/dL — AB (ref 0.6–1.2)
Calcium: 8.9 mg/dL (ref 8.0–10.3)
Chloride: 96 mEq/L — ABNORMAL LOW (ref 98–108)
GLUCOSE: 108 mg/dL (ref 73–118)
Potassium: 4.3 mEq/L (ref 3.3–4.7)
Sodium: 135 mEq/L (ref 128–145)
Total Protein: 6 g/dL — ABNORMAL LOW (ref 6.4–8.1)

## 2013-11-07 LAB — CBC WITH DIFFERENTIAL (CANCER CENTER ONLY)
BASO#: 0 10*3/uL (ref 0.0–0.2)
BASO%: 1 % (ref 0.0–2.0)
EOS ABS: 0.1 10*3/uL (ref 0.0–0.5)
EOS%: 1.7 % (ref 0.0–7.0)
HEMATOCRIT: 21.4 % — AB (ref 38.7–49.9)
HEMOGLOBIN: 7.4 g/dL — AB (ref 13.0–17.1)
LYMPH#: 0.6 10*3/uL — ABNORMAL LOW (ref 0.9–3.3)
LYMPH%: 19.5 % (ref 14.0–48.0)
MCH: 30.3 pg (ref 28.0–33.4)
MCHC: 34.6 g/dL (ref 32.0–35.9)
MCV: 88 fL (ref 82–98)
MONO#: 0.3 10*3/uL (ref 0.1–0.9)
MONO%: 10.6 % (ref 0.0–13.0)
NEUT#: 2 10*3/uL (ref 1.5–6.5)
NEUT%: 67.2 % (ref 40.0–80.0)
RBC: 2.44 10*6/uL — AB (ref 4.20–5.70)
RDW: 12.7 % (ref 11.1–15.7)
WBC: 3 10*3/uL — ABNORMAL LOW (ref 4.0–10.0)

## 2013-11-07 LAB — HOLD TUBE, BLOOD BANK - CHCC SATELLITE

## 2013-11-07 LAB — TECHNOLOGIST REVIEW CHCC SATELLITE

## 2013-11-07 MED ORDER — DRONABINOL 2.5 MG PO CAPS: 2.5000 mg | ORAL_CAPSULE | Freq: Two times a day (BID) | ORAL | Status: DC

## 2013-11-07 NOTE — Progress Notes (Signed)
Pt Dr Marin Olp, pt will receive blood and platelets tomorrow. Pt not to receive any treatment today.

## 2013-11-07 NOTE — Progress Notes (Signed)
Hematology and Oncology Follow Up Visit  Hunter Saunders [id number] 10/11/1955 58 y.o. 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  Saunders is back for a second office visit. He is looking pretty good. He now has a PICC line. This I thought would help him out quite a bit since he was being transfused.  He actually has had no problems bleeding. He is on Amicar now.  We are still transfusing him. We are giving him Xgeva. I this will help with respect to bony fractures.  His pain is under fairly good control right now.  He's had no nausea or vomiting. He's had some slight constipation. He's had no diarrhea. He's had no leg swelling. He's had no bruises.  His appetite is down. He wants to try to eat more. I thought maybe Marinol to help. I talked to him about this. He agrees to this. We will try 2.5 mg by mouth twice a day.  Overall, his performance status is ECOG 3.  Medications: Current outpatient prescriptions:ALPRAZolam (XANAX) 1 MG tablet, Take 1 tablet (1 mg total) by mouth at bedtime as needed for anxiety., Disp: 90 tablet, Rfl: 0;  aminocaproic acid (AMICAR) 500 MG tablet, Take 2 tablets 3 times a day, Disp: 180 tablet, Rfl: 2;  buPROPion (WELLBUTRIN XL) 300 MG 24 hr tablet, Take 300 mg by mouth daily., Disp: , Rfl: ;  Celecoxib (CELEBREX PO), Take 200 mg by mouth 2 (two) times daily., Disp: , Rfl:  Denosumab (XGEVA [place]), Inject into the skin every 3 (three) months. LAST DOSE  08-05-13, Disp: , Rfl: ;  HYDROmorphone (DILAUDID) 4 MG tablet, Take by mouth every 4 (four) hours as needed for severe pain., Disp: , Rfl: ;  lactulose (CHRONULAC) 10 GM/15ML solution, Take 15 mLs (10 g total) by mouth daily as needed for mild constipation., Disp: 240 mL, Rfl: 0 leuprolide (LUPRON) 22.5 MG injection, Inject  22.5 mg into the muscle every 3 (three) months. LAST DOSE 04-22-13, Disp: , Rfl: ;  methadone (DOLOPHINE) 5 MG tablet, Take 5 mg by mouth every 8 (eight) hours as needed., Disp: , Rfl: ;  predniSONE (DELTASONE) 10 MG tablet, Take 10 mg by mouth daily with breakfast., Disp: , Rfl: ;  pregabalin (LYRICA) 100 MG capsule, Take 1 capsule (100 mg total) by mouth 2 (two) times daily., Disp: 60 capsule, Rfl: 6 dronabinol (MARINOL) 2.5 MG capsule, Take 1 capsule (2.5 mg total) by mouth 2 (two) times daily before lunch and supper., Disp: 60 capsule, Rfl: 0;  ondansetron (ZOFRAN) 8 MG tablet, Take by mouth every 8 (eight) hours as needed for nausea or vomiting., Disp: , Rfl:   Allergies: No Known Allergies  Past Medical History, Surgical history, Social history, and Family History were reviewed and updated.  Review of Systems: As above  Physical Exam:  height is 5\' 9"  (1.753 m) and weight is 139 lb (63.05 kg). His oral temperature is 98.6 F (37 C). His blood pressure is 81/50 and his pulse is 56. His respiration is 20.   Thin, pale white gentleman. Head and exam shows temporal muscle wasting. He has no oral lesions. He does have a small hematoma on the lower lip. No intraoral hematomas are noted. Lungs are clear. Cardiac exam regular in rhythm. Abdomen is soft. Bowel sounds are slightly decreased. There  is no fluid wave. There is no palpable liver or spleen tip. Back exam shows no tenderness over the spine. Extremities shows muscle atrophy in upper and lower extremities. He has 3/5 strength bilaterally. Skin exam slightly dry. He has no ecchymoses. Neurological exam is nonfocal.     . Lab Results  Component Value Date   WBC 3.0* 11/07/2013   HGB 7.4* 11/07/2013   HCT 21.4* 11/07/2013   MCV 88 11/07/2013   PLT <6* 11/07/2013     Chemistry      Component Value Date/Time   NA 135 11/07/2013 0947   K 4.3 11/07/2013 0947   CL 96* 11/07/2013 0947   CO2 28 11/07/2013 0947   BUN 15 11/07/2013 0947   CREATININE  0.4* 11/07/2013 0947      Component Value Date/Time   CALCIUM 8.9 11/07/2013 0947   ALKPHOS 711* 11/07/2013 0947   AST 38 11/07/2013 0947   ALT 14 11/07/2013 0947   BILITOT 1.10 11/07/2013 0947         Impression and Plan: Hunter Saunders is 58 year old gentleman with metastatic refractory prostate cancer. He is basally palliative care right now.  He will we will go ahead and transfuse him. We'll give him I will give him one unit 2 units of blood. I get more units of platelets. I want to try to minimize platelet transfusions because of immunization to platelets.  Hopefully, the Marinol will help.  I spent about 30 minutes with he and his son. Hopefully, we will be able to get him some better quality of life.  I'll plan to see her back myself in another three out of four weeks.  I think hospice comes out and does blood work on him weekly.   Volanda Napoleon, MD 10/8/20152:18 PM

## 2013-11-08 ENCOUNTER — Ambulatory Visit (HOSPITAL_BASED_OUTPATIENT_CLINIC_OR_DEPARTMENT_OTHER)

## 2013-11-08 ENCOUNTER — Encounter: Payer: Self-pay | Admitting: Hematology & Oncology

## 2013-11-08 VITALS — BP 107/55 | HR 87 | Temp 98.4°F | Resp 18

## 2013-11-08 DIAGNOSIS — C7951 Secondary malignant neoplasm of bone: Secondary | ICD-10-CM

## 2013-11-08 DIAGNOSIS — D61818 Other pancytopenia: Secondary | ICD-10-CM | POA: Diagnosis not present

## 2013-11-08 DIAGNOSIS — D630 Anemia in neoplastic disease: Secondary | ICD-10-CM

## 2013-11-08 DIAGNOSIS — Z5111 Encounter for antineoplastic chemotherapy: Secondary | ICD-10-CM

## 2013-11-08 DIAGNOSIS — C61 Malignant neoplasm of prostate: Secondary | ICD-10-CM

## 2013-11-08 LAB — PREPARE RBC (CROSSMATCH)

## 2013-11-08 MED ORDER — DENOSUMAB 120 MG/1.7ML ~~LOC~~ SOLN
120.0000 mg | Freq: Once | SUBCUTANEOUS | Status: AC
  Administered 2013-11-08: 120 mg via SUBCUTANEOUS
  Filled 2013-11-08: qty 1.7

## 2013-11-08 MED ORDER — SODIUM CHLORIDE 0.9 % IJ SOLN
10.0000 mL | INTRAMUSCULAR | Status: AC | PRN
  Administered 2013-11-08: 10 mL
  Filled 2013-11-08: qty 10

## 2013-11-08 MED ORDER — SODIUM CHLORIDE 0.9 % IV SOLN: 250.0000 mL | Freq: Once | INTRAVENOUS | Status: DC

## 2013-11-08 MED ORDER — LEUPROLIDE ACETATE (4 MONTH) 30 MG IM KIT
30.0000 mg | PACK | Freq: Once | INTRAMUSCULAR | Status: AC
  Administered 2013-11-08: 30 mg via INTRAMUSCULAR
  Filled 2013-11-08: qty 30

## 2013-11-08 MED ORDER — ACETAMINOPHEN 325 MG PO TABS
ORAL_TABLET | ORAL | Status: AC
  Filled 2013-11-08: qty 2

## 2013-11-08 MED ORDER — HEPARIN SOD (PORK) LOCK FLUSH 100 UNIT/ML IV SOLN
250.0000 [IU] | INTRAVENOUS | Status: AC | PRN
  Administered 2013-11-08: 250 [IU]
  Filled 2013-11-08: qty 5

## 2013-11-08 MED ORDER — ACETAMINOPHEN 325 MG PO TABS
650.0000 mg | ORAL_TABLET | Freq: Once | ORAL | Status: AC
  Administered 2013-11-08: 650 mg via ORAL

## 2013-11-08 MED ORDER — DIPHENHYDRAMINE HCL 25 MG PO TABS
25.0000 mg | ORAL_TABLET | Freq: Once | ORAL | Status: AC
  Administered 2013-11-08: 25 mg via ORAL
  Filled 2013-11-08: qty 1

## 2013-11-08 MED ORDER — EPINEPHRINE HCL 0.1 MG/ML IJ SOLN: 0.2500 mg | Freq: Once | INTRAMUSCULAR | Status: DC | PRN

## 2013-11-08 MED ORDER — DIPHENHYDRAMINE HCL 25 MG PO CAPS
ORAL_CAPSULE | ORAL | Status: AC
  Filled 2013-11-08: qty 1

## 2013-11-08 MED ORDER — SODIUM CHLORIDE 0.9 % IV SOLN: Freq: Once | INTRAVENOUS | Status: DC | PRN

## 2013-11-08 NOTE — Patient Instructions (Addendum)
Denosumab injection What is this medicine? DENOSUMAB (den oh sue mab) slows bone breakdown. Prolia is used to treat osteoporosis in women after menopause and in men. Xgeva is used to prevent bone fractures and other bone problems caused by cancer bone metastases. Xgeva is also used to treat giant cell tumor of the bone. This medicine may be used for other purposes; ask your health care provider or pharmacist if you have questions. COMMON BRAND NAME(S): Prolia, XGEVA What should I tell my health care provider before I take this medicine? They need to know if you have any of these conditions: -dental disease -eczema -infection or history of infections -kidney disease or on dialysis -low blood calcium or vitamin D -malabsorption syndrome -scheduled to have surgery or tooth extraction -taking medicine that contains denosumab -thyroid or parathyroid disease -an unusual reaction to denosumab, other medicines, foods, dyes, or preservatives -pregnant or trying to get pregnant -breast-feeding How should I use this medicine? This medicine is for injection under the skin. It is given by a health care professional in a hospital or clinic setting. If you are getting Prolia, a special MedGuide will be given to you by the pharmacist with each prescription and refill. Be sure to read this information carefully each time. For Prolia, talk to your pediatrician regarding the use of this medicine in children. Special care may be needed. For Xgeva, talk to your pediatrician regarding the use of this medicine in children. While this drug may be prescribed for children as young as 13 years for selected conditions, precautions do apply. Overdosage: If you think you've taken too much of this medicine contact a poison control center or emergency room at once. Overdosage: If you think you have taken too much of this medicine contact a poison control center or emergency room at once. NOTE: This medicine is only for  you. Do not share this medicine with others. What if I miss a dose? It is important not to miss your dose. Call your doctor or health care professional if you are unable to keep an appointment. What may interact with this medicine? Do not take this medicine with any of the following medications: -other medicines containing denosumab This medicine may also interact with the following medications: -medicines that suppress the immune system -medicines that treat cancer -steroid medicines like prednisone or cortisone This list may not describe all possible interactions. Give your health care provider a list of all the medicines, herbs, non-prescription drugs, or dietary supplements you use. Also tell them if you smoke, drink alcohol, or use illegal drugs. Some items may interact with your medicine. What should I watch for while using this medicine? Visit your doctor or health care professional for regular checks on your progress. Your doctor or health care professional may order blood tests and other tests to see how you are doing. Call your doctor or health care professional if you get a cold or other infection while receiving this medicine. Do not treat yourself. This medicine may decrease your body's ability to fight infection. You should make sure you get enough calcium and vitamin D while you are taking this medicine, unless your doctor tells you not to. Discuss the foods you eat and the vitamins you take with your health care professional. See your dentist regularly. Brush and floss your teeth as directed. Before you have any dental work done, tell your dentist you are receiving this medicine. Do not become pregnant while taking this medicine or for 5 months after stopping   it. Women should inform their doctor if they wish to become pregnant or think they might be pregnant. There is a potential for serious side effects to an unborn child. Talk to your health care professional or pharmacist for more  information. What side effects may I notice from receiving this medicine? Side effects that you should report to your doctor or health care professional as soon as possible: -allergic reactions like skin rash, itching or hives, swelling of the face, lips, or tongue -breathing problems -chest pain -fast, irregular heartbeat -feeling faint or lightheaded, falls -fever, chills, or any other sign of infection -muscle spasms, tightening, or twitches -numbness or tingling -skin blisters or bumps, or is dry, peels, or red -slow healing or unexplained pain in the mouth or jaw -unusual bleeding or bruising Side effects that usually do not require medical attention (Report these to your doctor or health care professional if they continue or are bothersome.): -muscle pain -stomach upset, gas This list may not describe all possible side effects. Call your doctor for medical advice about side effects. You may report side effects to FDA at 1-800-FDA-1088. Where should I keep my medicine? This medicine is only given in a clinic, doctor's office, or other health care setting and will not be stored at home. NOTE: This sheet is a summary. It may not cover all possible information. If you have questions about this medicine, talk to your doctor, pharmacist, or health care provider.  2015, Elsevier/Gold Standard. (2011-07-18 12:37:47) Platelet Transfusion Information This is information about transfusions of platelets. Platelets are tiny cells made by the bone marrow and found in the blood. When a blood vessel is damaged, platelets rush to the damaged area to help form a clot. This begins the healing process. When platelets get very low, your blood may have trouble clotting. This may be from:  Illness.  Blood disorder.  Chemotherapy to treat cancer. Often, lower platelet counts do not cause problems.  Platelets usually last for 7 to 10 days. If they are not used in an injury, they are broken down by the  liver or spleen. Symptoms of low platelet count include:  Nosebleeds.  Bleeding gums.  Heavy periods.  Bruising and tiny blood spots in the skin.  Pinpoint spots of bleeding (petechiae).  Larger bruises (purpura).  Bleeding can be more serious if it happens in the brain or bowel. Platelet transfusions are often used to keep the platelet count at an acceptable level. Serious bleeding due to low platelets is uncommon. RISKS AND COMPLICATIONS Severe side effects from platelet transfusions are uncommon. Minor reactions may include:  Itching.  Rashes.  High temperature and shivering. Medications are available to stop transfusion reactions. Let your health care provider know if you develop any of the above problems.  If you are having platelet transfusions frequently, they may get less effective. This is called becoming refractory to platelets. It is uncommon. This can happen from non-immune causes and immune causes. Non-immune causes include:  High temperatures.  Some medications.  An enlarged spleen. Immune causes happen when your body discovers the platelets are not your own and begins making antibodies against them. The antibodies kill the platelets quickly. Even with platelet transfusions, you may still notice problems with bleeding or bruising. Let your health care providers know about this. Other things can be done to help if this happens.  BEFORE THE PROCEDURE   Your health care provider will check your platelet count regularly.  If the platelet count is too low,  it may be necessary to have a platelet transfusion.  This is more important before certain procedures with a risk of bleeding, such as a spinal tap.  Platelet transfusion reduces the risk of bleeding during or after the procedure.  Except in emergencies, giving a transfusion requires a written consent. Before blood is taken from a donor, a complete history is taken to make sure the person has no history of  previous diseases, nor engages in risky social behavior. Examples of this are intravenous drug use or sexual activity with multiple partners. This could lead to infected blood or blood products being used. This history is taken in spite of the extensive testing to make sure the blood is safe. All blood products transfused are tested to make sure it is a match for the person getting the blood. It is also checked for infections. Blood is the safest it has ever been. The risk of getting an infection is very low. PROCEDURE  The platelets are stored in small plastic bags that are kept at a low temperature.  Each bag is called a unit and sometimes two units are given. They are given through an intravenous line by drip infusion over about one-half hour.  Usually blood is collected from multiple people to get enough to transfuse.  Sometimes, the platelets are collected from a single person. This is done using a special machine that separates the platelets from the blood. The machine is called an apheresis machine. Platelets collected in this way are called apheresed platelets. Apheresed platelets reduce the risk of becoming sensitive to the platelets. This lowers the chances of having a transfusion reaction.  As it only takes a short time to give the platelets, this treatment can be given in an outpatient department. Platelets can also be given before or after other treatments. SEEK IMMEDIATE MEDICAL CARE IF: You have any of the following symptoms over the next 12 hours or several days:  Shaking chills.  Fever with a temperature greater than 102F (38.9C) develops.  Back pain or muscle pain.  People around you feel you are not acting correctly, or you are confused.  Blood in the urine or bowel movements, or bleeding from any place in your body.  Shortness of breath, or difficulty breathing.  Dizziness.  Fainting.  You break out in a rash or develop hives.  Decrease in the amount of urine  you are putting out, or the urine turns a dark color or changes to pink, red, or brown.  A severe headache or stiff neck.  Bruising more easily. Document Released: 11/14/2006 Document Revised: 06/03/2013 Document Reviewed: 11/14/2006 [facility] Patient Information 2015 [place], [place]. This information is not intended to replace advice given to you by your health care provider. Make sure you discuss any questions you have with your health care provider. Blood Transfusion  A blood transfusion replaces your blood or some of its parts. Blood is replaced when you have lost blood because of surgery, an accident, or for severe blood conditions like anemia. You can donate blood to be used on yourself if you have a planned surgery. If you lose blood during that surgery, your own blood can be given back to you. Any blood given to you is checked to make sure it matches your blood type. Your temperature, blood pressure, and heart rate (vital signs) will be checked often.  GET HELP RIGHT AWAY IF:   You feel sick to your stomach (nauseous) or throw up (vomit).  You have watery poop (  diarrhea).  You have shortness of breath or trouble breathing.  You have blood in your pee (urine) or have dark colored pee.  You have chest pain or tightness.  Your eyes or skin turn yellow (jaundice).  You have a temperature by mouth above 102 F (38.9 C), not controlled by medicine.  You start to shake and have chills.  You develop a a red rash (hives) or feel itchy.  You develop lightheadedness or feel confused.  You develop back, joint, or muscle pain.  You do not feel hungry (lost appetite).  You feel tired, restless, or nervous.  You develop belly (abdominal) cramps. Document Released: 04/15/2008 Document Revised: 04/11/2011 Document Reviewed: 04/15/2008 [employer] Patient Information 2015 [place], [place]. This information is not intended to replace advice given to you by your health care provider. Make  sure you discuss any questions you have with your health care provider. Leuprolide injection What is this medicine? LEUPROLIDE (loo PROE lide) is a man-made hormone. It is used to treat the symptoms of prostate cancer. This medicine may also be used to treat children with early onset of puberty. It may be used for other hormonal conditions. This medicine may be used for other purposes; ask your health care provider or pharmacist if you have questions. COMMON BRAND NAME(S): Lupron What should I tell my health care provider before I take this medicine? They need to know if you have any of these conditions: -diabetes -heart disease or previous heart attack -high blood pressure -high cholesterol -pain or difficulty passing urine -spinal cord metastasis -stroke -tobacco smoker -an unusual or allergic reaction to leuprolide, benzyl alcohol, other medicines, foods, dyes, or preservatives -pregnant or trying to get pregnant -breast-feeding How should I use this medicine? This medicine is for injection under the skin or into a muscle. You will be taught how to prepare and give this medicine. Use exactly as directed. Take your medicine at regular intervals. Do not take your medicine more often than directed. It is important that you put your used needles and syringes in a special sharps container. Do not put them in a trash can. If you do not have a sharps container, call your pharmacist or healthcare provider to get one. Talk to your pediatrician regarding the use of this medicine in children. While this medicine may be prescribed for children as young as 8 years for selected conditions, precautions do apply. Overdosage: If you think you have taken too much of this medicine contact a poison control center or emergency room at once. NOTE: This medicine is only for you. Do not share this medicine with others. What if I miss a dose? If you miss a dose, take it as soon as you can. If it is almost time  for your next dose, take only that dose. Do not take double or extra doses. What may interact with this medicine? Do not take this medicine with any of the following medications: -chasteberry This medicine may also interact with the following medications: -herbal or dietary supplements, like black cohosh or DHEA -male hormones, like estrogens or progestins and birth control pills, patches, rings, or injections -male hormones, like testosterone This list may not describe all possible interactions. Give your health care provider a list of all the medicines, herbs, non-prescription drugs, or dietary supplements you use. Also tell them if you smoke, drink alcohol, or use illegal drugs. Some items may interact with your medicine. What should I watch for while using this medicine? Visit your doctor or  health care professional for regular checks on your progress. During the first week, your symptoms may get worse, but then will improve as you continue your treatment. You may get hot flashes, increased bone pain, increased difficulty passing urine, or an aggravation of nerve symptoms. Discuss these effects with your doctor or health care professional, some of them may improve with continued use of this medicine. Male patients may experience a menstrual cycle or spotting during the first 2 months of therapy with this medicine. If this continues, contact your doctor or health care professional. What side effects may I notice from receiving this medicine? Side effects that you should report to your doctor or health care professional as soon as possible: -allergic reactions like skin rash, itching or hives, swelling of the face, lips, or tongue -breathing problems -chest pain -depression or memory disorders -pain in your legs or groin -pain at site where injected -severe headache -swelling of the feet and legs -visual changes -vomiting Side effects that usually do not require medical attention  (report to your doctor or health care professional if they continue or are bothersome): -breast swelling or tenderness -decrease in sex drive or performance -diarrhea -hot flashes -loss of appetite -muscle, joint, or bone pains -nausea -redness or irritation at site where injected -skin problems or acne This list may not describe all possible side effects. Call your doctor for medical advice about side effects. You may report side effects to FDA at 1-800-FDA-1088. Where should I keep my medicine? Keep out of the reach of children. Store below 25 degrees C (77 degrees F). Do not freeze. Protect from light. Do not use if it is not clear or if there are particles present. Throw away any unused medicine after the expiration date. NOTE: This sheet is a summary. It may not cover all possible information. If you have questions about this medicine, talk to your doctor, pharmacist, or health care provider.  2015, Elsevier/Gold Standard. (2008-06-03 13:26:20)

## 2013-11-09 LAB — PREPARE PLATELET PHERESIS: Unit division: 0

## 2013-11-09 LAB — TYPE AND SCREEN
ABO/RH(D): A POS
ANTIBODY SCREEN: NEGATIVE
UNIT DIVISION: 0
Unit division: 0

## 2013-11-11 ENCOUNTER — Telehealth: Payer: Self-pay

## 2013-11-11 ENCOUNTER — Encounter: Payer: Self-pay | Admitting: Hematology & Oncology

## 2013-11-11 NOTE — Telephone Encounter (Signed)
Hunter Saunders from Hospice contact our office to re: CBC results for pt. H&H - 8.5 & 24.7 & platelets 11.  Dr Marin Olp aware. No new orders at this time. dph

## 2013-11-18 ENCOUNTER — Encounter: Payer: Self-pay | Admitting: Nurse Practitioner

## 2013-11-18 ENCOUNTER — Other Ambulatory Visit: Payer: Self-pay | Admitting: Nurse Practitioner

## 2013-11-18 DIAGNOSIS — C61 Malignant neoplasm of prostate: Secondary | ICD-10-CM

## 2013-11-18 DIAGNOSIS — D61818 Other pancytopenia: Secondary | ICD-10-CM

## 2013-11-18 NOTE — Progress Notes (Signed)
Received a call from Crosby with [facility] [phone]) stating pt was having some coughing and his brother recently got over a cold. Per Patty, they will proceed with symptom management and pt will begin taking guaifenesin as prescribed.

## 2013-11-19 ENCOUNTER — Encounter: Payer: Self-pay | Admitting: *Deleted

## 2013-11-19 ENCOUNTER — Ambulatory Visit (HOSPITAL_BASED_OUTPATIENT_CLINIC_OR_DEPARTMENT_OTHER)

## 2013-11-19 VITALS — BP 94/53 | HR 76 | Temp 98.0°F | Resp 16

## 2013-11-19 DIAGNOSIS — C61 Malignant neoplasm of prostate: Secondary | ICD-10-CM

## 2013-11-19 DIAGNOSIS — D61818 Other pancytopenia: Secondary | ICD-10-CM

## 2013-11-19 DIAGNOSIS — D619 Aplastic anemia, unspecified: Secondary | ICD-10-CM

## 2013-11-19 LAB — PREPARE RBC (CROSSMATCH)

## 2013-11-19 MED ORDER — ACETAMINOPHEN 325 MG PO TABS
ORAL_TABLET | ORAL | Status: AC
  Filled 2013-11-19: qty 2

## 2013-11-19 MED ORDER — DIPHENHYDRAMINE HCL 25 MG PO CAPS
ORAL_CAPSULE | ORAL | Status: AC
  Filled 2013-11-19: qty 1

## 2013-11-19 MED ORDER — HEPARIN SOD (PORK) LOCK FLUSH 100 UNIT/ML IV SOLN
500.0000 [IU] | Freq: Every day | INTRAVENOUS | Status: AC | PRN
  Administered 2013-11-19: 500 [IU]
  Filled 2013-11-19: qty 5

## 2013-11-19 MED ORDER — ACETAMINOPHEN 325 MG PO TABS
650.0000 mg | ORAL_TABLET | Freq: Once | ORAL | Status: AC
  Administered 2013-11-19: 650 mg via ORAL

## 2013-11-19 MED ORDER — SODIUM CHLORIDE 0.9 % IJ SOLN
10.0000 mL | INTRAMUSCULAR | Status: AC | PRN
  Administered 2013-11-19: 10 mL
  Filled 2013-11-19: qty 10

## 2013-11-19 MED ORDER — DIPHENHYDRAMINE HCL 25 MG PO CAPS
25.0000 mg | ORAL_CAPSULE | Freq: Once | ORAL | Status: AC
  Administered 2013-11-19: 25 mg via ORAL

## 2013-11-19 NOTE — Progress Notes (Signed)
Per NP, Lasix not administered between blood transfusions today due to patient's blood pressure.

## 2013-11-19 NOTE — Patient Instructions (Signed)

## 2013-11-20 ENCOUNTER — Encounter: Payer: Self-pay | Admitting: Hematology & Oncology

## 2013-11-20 ENCOUNTER — Other Ambulatory Visit: Payer: Self-pay | Admitting: *Deleted

## 2013-11-20 ENCOUNTER — Telehealth: Payer: Self-pay | Admitting: *Deleted

## 2013-11-20 DIAGNOSIS — D61818 Other pancytopenia: Secondary | ICD-10-CM

## 2013-11-20 DIAGNOSIS — C61 Malignant neoplasm of prostate: Secondary | ICD-10-CM

## 2013-11-20 LAB — PREPARE PLATELET PHERESIS: Unit division: 0

## 2013-11-20 LAB — TYPE AND SCREEN
ABO/RH(D): A POS
ANTIBODY SCREEN: NEGATIVE
UNIT DIVISION: 0
Unit division: 0

## 2013-11-20 MED ORDER — AMINOCAPROIC ACID 500 MG PO TABS: ORAL_TABLET | ORAL | Status: AC

## 2013-11-20 NOTE — Telephone Encounter (Signed)
Refill sent to pharmacy.   

## 2013-11-21 ENCOUNTER — Telehealth: Payer: Self-pay | Admitting: *Deleted

## 2013-11-21 ENCOUNTER — Other Ambulatory Visit: Payer: Self-pay | Admitting: *Deleted

## 2013-11-21 DIAGNOSIS — C61 Malignant neoplasm of prostate: Secondary | ICD-10-CM

## 2013-11-21 MED ORDER — BENZONATATE 100 MG PO CAPS: 200.0000 mg | ORAL_CAPSULE | Freq: Three times a day (TID) | ORAL | Status: DC | PRN

## 2013-11-21 NOTE — Telephone Encounter (Signed)
Pt's son called stating the patient was becoming nauseous and sometimes vomiting from his productive cough. Spoke to NP, [facility] and order for tessalon pearls obtained. Sent to pharmacy and patient's son notified. Son also questions whether or not he needs an increase in lab drawing and blood transfusion frequency. This will need to be discussed with Ennever upon his return.

## 2013-11-26 ENCOUNTER — Other Ambulatory Visit: Payer: Self-pay | Admitting: *Deleted

## 2013-11-26 DIAGNOSIS — D61818 Other pancytopenia: Secondary | ICD-10-CM | POA: Diagnosis not present

## 2013-11-26 DIAGNOSIS — C61 Malignant neoplasm of prostate: Secondary | ICD-10-CM

## 2013-11-26 LAB — PREPARE RBC (CROSSMATCH)

## 2013-11-27 ENCOUNTER — Telehealth: Payer: Self-pay | Admitting: *Deleted

## 2013-11-27 NOTE — Telephone Encounter (Signed)
Darnelle Maffucci called the office stating that patient had been declining, is confused and extremely weak. Hospice called to visit patient and draw a CMP stat. Will follow up with results.

## 2013-11-28 ENCOUNTER — Encounter: Payer: Self-pay | Admitting: Hematology & Oncology

## 2013-11-28 ENCOUNTER — Other Ambulatory Visit: Payer: Self-pay

## 2013-11-28 ENCOUNTER — Ambulatory Visit (HOSPITAL_BASED_OUTPATIENT_CLINIC_OR_DEPARTMENT_OTHER)

## 2013-11-28 ENCOUNTER — Other Ambulatory Visit (HOSPITAL_BASED_OUTPATIENT_CLINIC_OR_DEPARTMENT_OTHER): Admitting: Lab

## 2013-11-28 ENCOUNTER — Ambulatory Visit (HOSPITAL_BASED_OUTPATIENT_CLINIC_OR_DEPARTMENT_OTHER): Admitting: Hematology & Oncology

## 2013-11-28 ENCOUNTER — Other Ambulatory Visit: Payer: Self-pay | Admitting: *Deleted

## 2013-11-28 VITALS — BP 100/52 | HR 62 | Temp 94.5°F | Resp 14 | Ht 69.0 in | Wt 139.0 lb

## 2013-11-28 VITALS — BP 117/68 | HR 96 | Temp 98.0°F | Resp 14

## 2013-11-28 DIAGNOSIS — C61 Malignant neoplasm of prostate: Secondary | ICD-10-CM

## 2013-11-28 DIAGNOSIS — R64 Cachexia: Secondary | ICD-10-CM

## 2013-11-28 DIAGNOSIS — D61818 Other pancytopenia: Secondary | ICD-10-CM | POA: Diagnosis not present

## 2013-11-28 DIAGNOSIS — J189 Pneumonia, unspecified organism: Secondary | ICD-10-CM

## 2013-11-28 DIAGNOSIS — D619 Aplastic anemia, unspecified: Secondary | ICD-10-CM

## 2013-11-28 LAB — CMP (CANCER CENTER ONLY)
ALBUMIN: 2.3 g/dL — AB (ref 3.3–5.5)
ALT: 20 U/L (ref 10–47)
AST: 87 U/L — ABNORMAL HIGH (ref 11–38)
Alkaline Phosphatase: 462 U/L — ABNORMAL HIGH (ref 26–84)
BILIRUBIN TOTAL: 3.7 mg/dL — AB (ref 0.20–1.60)
BUN, Bld: 54 mg/dL — ABNORMAL HIGH (ref 7–22)
CALCIUM: 7.2 mg/dL — AB (ref 8.0–10.3)
CHLORIDE: 88 meq/L — AB (ref 98–108)
CO2: 24 mEq/L (ref 18–33)
Creat: 0.7 mg/dl (ref 0.6–1.2)
GLUCOSE: 124 mg/dL — AB (ref 73–118)
Potassium: 3.8 mEq/L (ref 3.3–4.7)
SODIUM: 138 meq/L (ref 128–145)
TOTAL PROTEIN: 4.9 g/dL — AB (ref 6.4–8.1)

## 2013-11-28 LAB — CBC WITH DIFFERENTIAL (CANCER CENTER ONLY)
BASO#: 0 10*3/uL (ref 0.0–0.2)
BASO%: 0.2 % (ref 0.0–2.0)
EOS%: 1.6 % (ref 0.0–7.0)
Eosinophils Absolute: 0.1 10*3/uL (ref 0.0–0.5)
HCT: 15.5 % — ABNORMAL LOW (ref 38.7–49.9)
HEMOGLOBIN: 5 g/dL — AB (ref 13.0–17.1)
LYMPH#: 1.3 10*3/uL (ref 0.9–3.3)
LYMPH%: 28.4 % (ref 14.0–48.0)
MCH: 27.9 pg — AB (ref 28.0–33.4)
MCHC: 32.3 g/dL (ref 32.0–35.9)
MCV: 87 fL (ref 82–98)
MONO#: 0.8 10*3/uL (ref 0.1–0.9)
MONO%: 18.2 % — AB (ref 0.0–13.0)
NEUT%: 51.6 % (ref 40.0–80.0)
NEUTROS ABS: 2.3 10*3/uL (ref 1.5–6.5)
Platelets: <6 10*3/uL — CL (ref 145–400)
RBC: 1.79 10*6/uL — AB (ref 4.20–5.70)
RDW: 14.8 % (ref 11.1–15.7)

## 2013-11-28 LAB — TECHNOLOGIST REVIEW CHCC SATELLITE

## 2013-11-28 LAB — PREALBUMIN: Prealbumin: 4.3 mg/dL — ABNORMAL LOW (ref 17.0–34.0)

## 2013-11-28 MED ORDER — SODIUM CHLORIDE 0.9 % IV SOLN
2.0000 g | Freq: Once | INTRAVENOUS | Status: AC
  Administered 2013-11-28: 2 g via INTRAVENOUS
  Filled 2013-11-28: qty 2

## 2013-11-28 MED ORDER — ACETAMINOPHEN 325 MG PO TABS
650.0000 mg | ORAL_TABLET | Freq: Once | ORAL | Status: AC
  Administered 2013-11-28: 650 mg via ORAL

## 2013-11-28 MED ORDER — SODIUM CHLORIDE 0.9 % IJ SOLN
10.0000 mL | INTRAMUSCULAR | Status: AC | PRN
  Administered 2013-11-28: 10 mL
  Filled 2013-11-28: qty 10

## 2013-11-28 MED ORDER — DEXAMETHASONE SODIUM PHOSPHATE 10 MG/ML IJ SOLN
10.0000 mg | Freq: Once | INTRAMUSCULAR | Status: AC
  Administered 2013-11-28: 10 mg via INTRAVENOUS

## 2013-11-28 MED ORDER — DEXAMETHASONE SODIUM PHOSPHATE 10 MG/ML IJ SOLN
INTRAMUSCULAR | Status: AC
  Filled 2013-11-28: qty 1

## 2013-11-28 MED ORDER — GRANISETRON 3.1 MG/24HR TD PTCH: MEDICATED_PATCH | TRANSDERMAL | Status: AC

## 2013-11-28 MED ORDER — HYDROMORPHONE HCL 4 MG/ML IJ SOLN
INTRAMUSCULAR | Status: AC
  Filled 2013-11-28: qty 1

## 2013-11-28 MED ORDER — ONDANSETRON 16 MG/50ML IVPB (CHCC)
16.0000 mg | Freq: Once | INTRAVENOUS | Status: AC
  Administered 2013-11-28: 16 mg via INTRAVENOUS

## 2013-11-28 MED ORDER — HEPARIN SOD (PORK) LOCK FLUSH 100 UNIT/ML IV SOLN
250.0000 [IU] | INTRAVENOUS | Status: AC | PRN
  Administered 2013-11-28: 250 [IU]
  Filled 2013-11-28: qty 5

## 2013-11-28 MED ORDER — DRONABINOL 5 MG PO CAPS: 5.0000 mg | ORAL_CAPSULE | Freq: Two times a day (BID) | ORAL | Status: AC

## 2013-11-28 MED ORDER — METHADONE HCL 5 MG/5ML PO SOLN: 5.0000 mg | Freq: Three times a day (TID) | ORAL | Status: DC | PRN

## 2013-11-28 MED ORDER — FUROSEMIDE 10 MG/ML IJ SOLN: 20.0000 mg | Freq: Once | INTRAMUSCULAR | Status: DC

## 2013-11-28 MED ORDER — FUROSEMIDE 10 MG/ML IJ SOLN
INTRAMUSCULAR | Status: AC
  Filled 2013-11-28: qty 4

## 2013-11-28 MED ORDER — HYDROCOD POLST-CHLORPHEN POLST 10-8 MG/5ML PO LQCR: 5.0000 mL | Freq: Two times a day (BID) | ORAL | Status: AC | PRN

## 2013-11-28 MED ORDER — ACETAMINOPHEN 325 MG PO TABS
ORAL_TABLET | ORAL | Status: AC
  Filled 2013-11-28: qty 2

## 2013-11-28 MED ORDER — DIPHENHYDRAMINE HCL 25 MG PO CAPS
ORAL_CAPSULE | ORAL | Status: AC
  Filled 2013-11-28: qty 1

## 2013-11-28 MED ORDER — DIPHENHYDRAMINE HCL 25 MG PO CAPS
25.0000 mg | ORAL_CAPSULE | Freq: Once | ORAL | Status: AC
  Administered 2013-11-28: 25 mg via ORAL

## 2013-11-28 MED ORDER — SODIUM CHLORIDE 0.9 % IV SOLN
INTRAVENOUS | Status: DC
  Administered 2013-11-28 (×2): via INTRAVENOUS

## 2013-11-28 MED ORDER — CEFDINIR 250 MG/5ML PO SUSR: 250.0000 mg | Freq: Two times a day (BID) | ORAL | Status: AC

## 2013-11-28 MED ORDER — HEPARIN SOD (PORK) LOCK FLUSH 100 UNIT/ML IV SOLN
250.0000 [IU] | INTRAVENOUS | Status: DC | PRN
  Filled 2013-11-28: qty 5

## 2013-11-28 MED ORDER — SODIUM CHLORIDE 0.9 % IV SOLN: 250.0000 mL | Freq: Once | INTRAVENOUS | Status: DC

## 2013-11-28 MED ORDER — HYDROMORPHONE HCL 4 MG/ML IJ SOLN
2.0000 mg | Freq: Once | INTRAMUSCULAR | Status: AC
  Administered 2013-11-28: 2 mg via SUBCUTANEOUS

## 2013-11-28 MED ORDER — ONDANSETRON 16 MG/50ML IVPB (CHCC)
INTRAVENOUS | Status: AC
  Filled 2013-11-28: qty 16

## 2013-11-28 MED ORDER — OXYCODONE HCL 20 MG/ML PO CONC: 20.0000 mg | ORAL | Status: AC | PRN

## 2013-11-28 NOTE — Patient Instructions (Signed)

## 2013-11-28 NOTE — Progress Notes (Signed)
Panic HGB 5.0 and PLT <6. Notified MD. Patient already in office for PRBC transfusion.

## 2013-11-28 NOTE — Progress Notes (Signed)
Hematology and Oncology Follow Up Visit  Hunter Saunders [id number] Aug 21, 1955 58 y.o. 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  Saunders is back for follow-up. He clearly has declined since we last saw him. Thank you, he still has his PICC line in.  He looks much more weak. He looks much more cachectic. He is not eating much at all. Thankfully, he is not having a lot of pain.  He is coughing. He is not bringing up any purulent sputum. However, he may have an pulmonary infection. I will go ahead and give him a dose of Rocephin in the office. I will then put him on some Omnicef at home.  He is having a hard time swallowing. I will try most some OxyFast to see if this does not help with breakthrough pain.  I think it is apparent that his malignancy is progressing.  Thankfully, he has had no problems with bleeding.  Hospice is following him at home.  He's having nausea. I will try him on a Sancuso patch to see if this does not help with nausea.  We have some laboratory done on him a couple days ago at home. Everything did not look too bad.  His family is worried about him getting dehydrated. I gets her [place] about hospice doing some IV fluids for him at home.  Overall, his performance status is ECOG 4..  Medications: Current outpatient prescriptions:ALPRAZolam (XANAX) 1 MG tablet, Take 1 tablet (1 mg total) by mouth at bedtime as needed for anxiety., Disp: 90 tablet, Rfl: 0;  aminocaproic acid (AMICAR) 500 MG tablet, Take 2 tablets 3 times a day, Disp: 180 tablet, Rfl: 2;  buPROPion (WELLBUTRIN XL) 300 MG 24 hr tablet, Take 300 mg by mouth daily., Disp: , Rfl: ;  Denosumab (XGEVA [place]), Inject into the skin every 3 (three) months. LAST DOSE  08-05-13, Disp: , Rfl:  dronabinol (MARINOL) 5 MG capsule,  Take 1 capsule (5 mg total) by mouth 2 (two) times daily before lunch and supper., Disp: 60 capsule, Rfl: 0;  lactulose (CHRONULAC) 10 GM/15ML solution, Take 15 mLs (10 g total) by mouth daily as needed for mild constipation., Disp: 240 mL, Rfl: 0;  leuprolide (LUPRON) 22.5 MG injection, Inject 22.5 mg into the muscle every 3 (three) months. LAST DOSE 04-22-13, Disp: , Rfl:  methadone (DOLOPHINE) 5 MG tablet, Take 5 mg by mouth every 8 (eight) hours as needed., Disp: , Rfl: ;  ondansetron (ZOFRAN) 8 MG tablet, Take by mouth every 8 (eight) hours as needed for nausea or vomiting., Disp: , Rfl: ;  predniSONE (DELTASONE) 10 MG tablet, Take 10 mg by mouth daily with breakfast., Disp: , Rfl: ;  cefdinir (OMNICEF) 250 MG/5ML suspension, Take 5 mLs (250 mg total) by mouth 2 (two) times daily., Disp: 60 mL, Rfl: 0 chlorpheniramine-HYDROcodone (TUSSIONEX) 10-8 MG/5ML LQCR, Take 5 mLs by mouth every 12 (twelve) hours as needed for cough., Disp: 250 mL, Rfl: 0;  oxyCODONE (ROXICODONE INTENSOL) 20 MG/ML concentrated solution, Take 1 mL (20 mg total) by mouth every 4 (four) hours as needed for severe pain., Disp: 60 mL, Rfl: 0 Current facility-administered medications:0.9 %  sodium chloride infusion, , Intravenous, Continuous, Volanda Napoleon, MD, Last Rate: 100 mL/hr at 11/28/13 1006 Facility-Administered Medications Ordered in Other Visits: 0.9 %  sodium  chloride infusion, 250 mL, Intravenous, Once, Rulon Eisenmenger, MD;  furosemide (LASIX) injection 20 mg, 20 mg, Intravenous, Once, Volanda Napoleon, MD;  heparin lock flush 100 unit/mL, 250 Units, Intracatheter, PRN, Volanda Napoleon, MD;  heparin lock flush 100 unit/mL, 250 Units, Intracatheter, PRN, Rulon Eisenmenger, MD sodium chloride 0.9 % injection 10 mL, 10 mL, Intracatheter, PRN, Volanda Napoleon, MD  Allergies: No Known Allergies  Past Medical History, Surgical history, Social history, and Family History were reviewed and updated.  Review of Systems: As  above  Physical Exam:  height is 5\' 9"  (1.753 m) and weight is 139 lb (63.05 kg). His temperature is 94.5 F (34.7 C). His blood pressure is 100/52 and his pulse is 62. His respiration is 14.   Thin, pale white gentleman. Head and exam shows temporal muscle wasting. He has no oral lesions. He does have a small hematoma on the lower lip. No intraoral hematomas are noted. Lungs are clear. Cardiac exam regular in rhythm. Abdomen is soft. Bowel sounds are slightly decreased. There is no fluid wave.  Back exam shows no tenderness over the spine. Extremities shows muscle atrophy in upper and lower extremities. He has 3/5 strength bilaterally. Skin exam slightly dry. He has no ecchymoses. Neurological exam is nonfocal.     . Lab Results  Component Value Date   WBC 3.9 Corrected for nRBC* 11/28/2013   HGB 5.0* 11/28/2013   HCT 15.5* 11/28/2013   MCV 87 11/28/2013   PLT <6* 11/28/2013     Chemistry      Component Value Date/Time   NA 138 11/28/2013 0917   K 3.8 11/28/2013 0917   CL 88* 11/28/2013 0917   CO2 24 11/28/2013 0917   BUN 54* 11/28/2013 0917   CREATININE 0.7 11/28/2013 0917      Component Value Date/Time   CALCIUM 7.2* 11/28/2013 0917   ALKPHOS 462* 11/28/2013 0917   AST 87* 11/28/2013 0917   ALT 20 11/28/2013 0917   BILITOT 3.70* 11/28/2013 0917         Impression and Plan: Hunter Saunders is 58 year old gentleman with metastatic refractory prostate cancer. He is basally palliative care right now. He is becoming much more cachectic. I really believe that his cancer is progressing. his liver edge is now about 3 cm below the right costal margin. He never has had hepatomegaly.  His hemoglobin is only 5. I think that he probably has further involvement of his bone marrow.   He will we will go ahead and transfuse him. We'll give him I will give him one unit 2 units of blood. I get more units of platelets. I want to try to minimize platelet transfusions because of immunization to  platelets. He is not bleeding so the Amicar is helping despite his thrombocytopenia     I will increase the Marinol up to 5 mg twice a day this might help his appetite a little bit.  It would not surprise me if he does not make it more than 3 or 4 weeks.  I spent about 45 minutes with he and his family.   I'll plan to see him back myself in another 2 or 3 weeks    Volanda Napoleon, MD 10/29/20151:35 PM

## 2013-11-29 ENCOUNTER — Encounter: Payer: Self-pay | Admitting: *Deleted

## 2013-11-29 ENCOUNTER — Ambulatory Visit: Payer: Medicare Other | Admitting: Hematology & Oncology

## 2013-11-29 ENCOUNTER — Other Ambulatory Visit: Payer: Medicare Other | Admitting: Lab

## 2013-11-29 ENCOUNTER — Encounter: Payer: Self-pay | Admitting: Hematology & Oncology

## 2013-11-29 LAB — TYPE AND SCREEN
ABO/RH(D): A POS
Antibody Screen: NEGATIVE
UNIT DIVISION: 0
Unit division: 0

## 2013-12-02 ENCOUNTER — Encounter: Payer: Self-pay | Admitting: *Deleted

## 2013-12-02 NOTE — Progress Notes (Addendum)
Received notification from [facility] that patient died at home on 22-Dec-2013. At this time we don't have an exact time. Time of death 3:12am

## 2013-12-02 NOTE — Progress Notes (Signed)
Time of death 3:12am

## 2013-12-10 ENCOUNTER — Other Ambulatory Visit: Payer: Medicare Other | Admitting: Lab

## 2013-12-10 ENCOUNTER — Ambulatory Visit: Payer: Medicare Other | Admitting: Hematology & Oncology

## 2013-12-31 DEATH — deceased

## 2014-04-24 ENCOUNTER — Other Ambulatory Visit: Payer: Self-pay | Admitting: Family

## 2015-07-01 IMAGING — US IR FLUORO GUIDE CV LINE*R*
1 series · 1 of 1 positions shown · non-contrast
Comparison: none

CLINICAL DATA: Metastatic prostate cancer

[Series 1: sp fluoro guide cv line*left* · 1 of 1 slices shown]
[im 1/1]
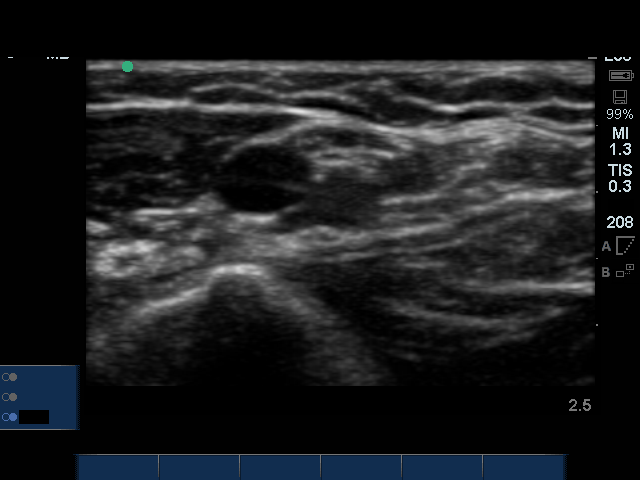

[1 of 1 positions shown; findings below may reference images not displayed]

EXAM:
POWER PICC LINE PLACEMENT WITH ULTRASOUND AND FLUOROSCOPIC GUIDANCE

FLUOROSCOPY TIME:  12 seconds

PROCEDURE:
The patient was advised of the possible risks andcomplications and
agreed to undergo the procedure. The patient was then brought to the
angiographic suite for the procedure.

The right arm was prepped with chlorhexidine, drapedin the usual
sterile fashion using maximum barrier technique (cap and mask,
sterile gown, sterile gloves, large sterile sheet, hand hygiene and
cutaneous antisepsis) and infiltrated locally with 1% Lidocaine.

Ultrasound demonstrated patency of the right brachial vein, and this
was documented with an image. Under real-time ultrasound guidance,
this vein was accessed with a 21 gauge micropuncture needle and
image documentation was performed. A [DATE] wire was introduced in to
the vein. Over this, a 5 French double lumen power PICC was advanced
to the lower SVC/right atrial junction. Fluoroscopy during the
procedure and fluoro spot radiograph confirms appropriate catheter
position. The catheter was flushed and covered with asterile
dressing.

Catheter length: 43

Complications: No immediate
IMPRESSION: Successful right arm power PICC line placement with ultrasound and
fluoroscopic guidance. The catheter is ready for use.
